# Patient Record
Sex: Male | Born: 1980 | Race: White | Hispanic: No | Marital: Single | Smoking: Current every day smoker
Health system: Southern US, Community
[De-identification: ages and names within clinical notes are randomized; demographics above are authoritative.]

## PROBLEM LIST (undated history)

## (undated) DIAGNOSIS — J45909 Unspecified asthma, uncomplicated: Secondary | ICD-10-CM

---
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
## 2009-07-08 ENCOUNTER — Emergency Department (HOSPITAL_BASED_OUTPATIENT_CLINIC_OR_DEPARTMENT_OTHER): Admission: EM | Admit: 2009-07-08 | Discharge: 2009-07-08 | Payer: Self-pay | Admitting: Emergency Medicine

## 2009-07-08 ENCOUNTER — Ambulatory Visit: Payer: Self-pay | Admitting: Interventional Radiology

## 2009-08-02 ENCOUNTER — Ambulatory Visit: Payer: Self-pay | Admitting: Diagnostic Radiology

## 2009-08-02 ENCOUNTER — Emergency Department (HOSPITAL_BASED_OUTPATIENT_CLINIC_OR_DEPARTMENT_OTHER): Admission: EM | Admit: 2009-08-02 | Discharge: 2009-08-03 | Payer: Self-pay | Admitting: Emergency Medicine

## 2009-11-06 ENCOUNTER — Encounter: Admission: RE | Admit: 2009-11-06 | Discharge: 2009-11-06 | Payer: Self-pay | Admitting: Internal Medicine

## 2009-11-17 ENCOUNTER — Ambulatory Visit: Payer: Self-pay | Admitting: Diagnostic Radiology

## 2009-11-17 ENCOUNTER — Emergency Department (HOSPITAL_BASED_OUTPATIENT_CLINIC_OR_DEPARTMENT_OTHER): Admission: EM | Admit: 2009-11-17 | Discharge: 2009-11-17 | Payer: Self-pay | Admitting: Emergency Medicine

## 2009-11-26 ENCOUNTER — Ambulatory Visit (HOSPITAL_COMMUNITY): Admission: RE | Admit: 2009-11-26 | Discharge: 2009-11-26 | Payer: Self-pay | Admitting: Family Medicine

## 2009-12-01 ENCOUNTER — Emergency Department (HOSPITAL_COMMUNITY): Admission: EM | Admit: 2009-12-01 | Discharge: 2009-12-01 | Payer: Self-pay | Admitting: Family Medicine

## 2010-08-23 IMAGING — CR DG HAND COMPLETE 3+V*R*
3 series · 3 of 3 positions shown · non-contrast
Comparison: None.

CLINICAL DATA: Right hand injury.  Pain fourth MCP.

RIGHT HAND - COMPLETE 3+ VIEW

[x hand pa right]
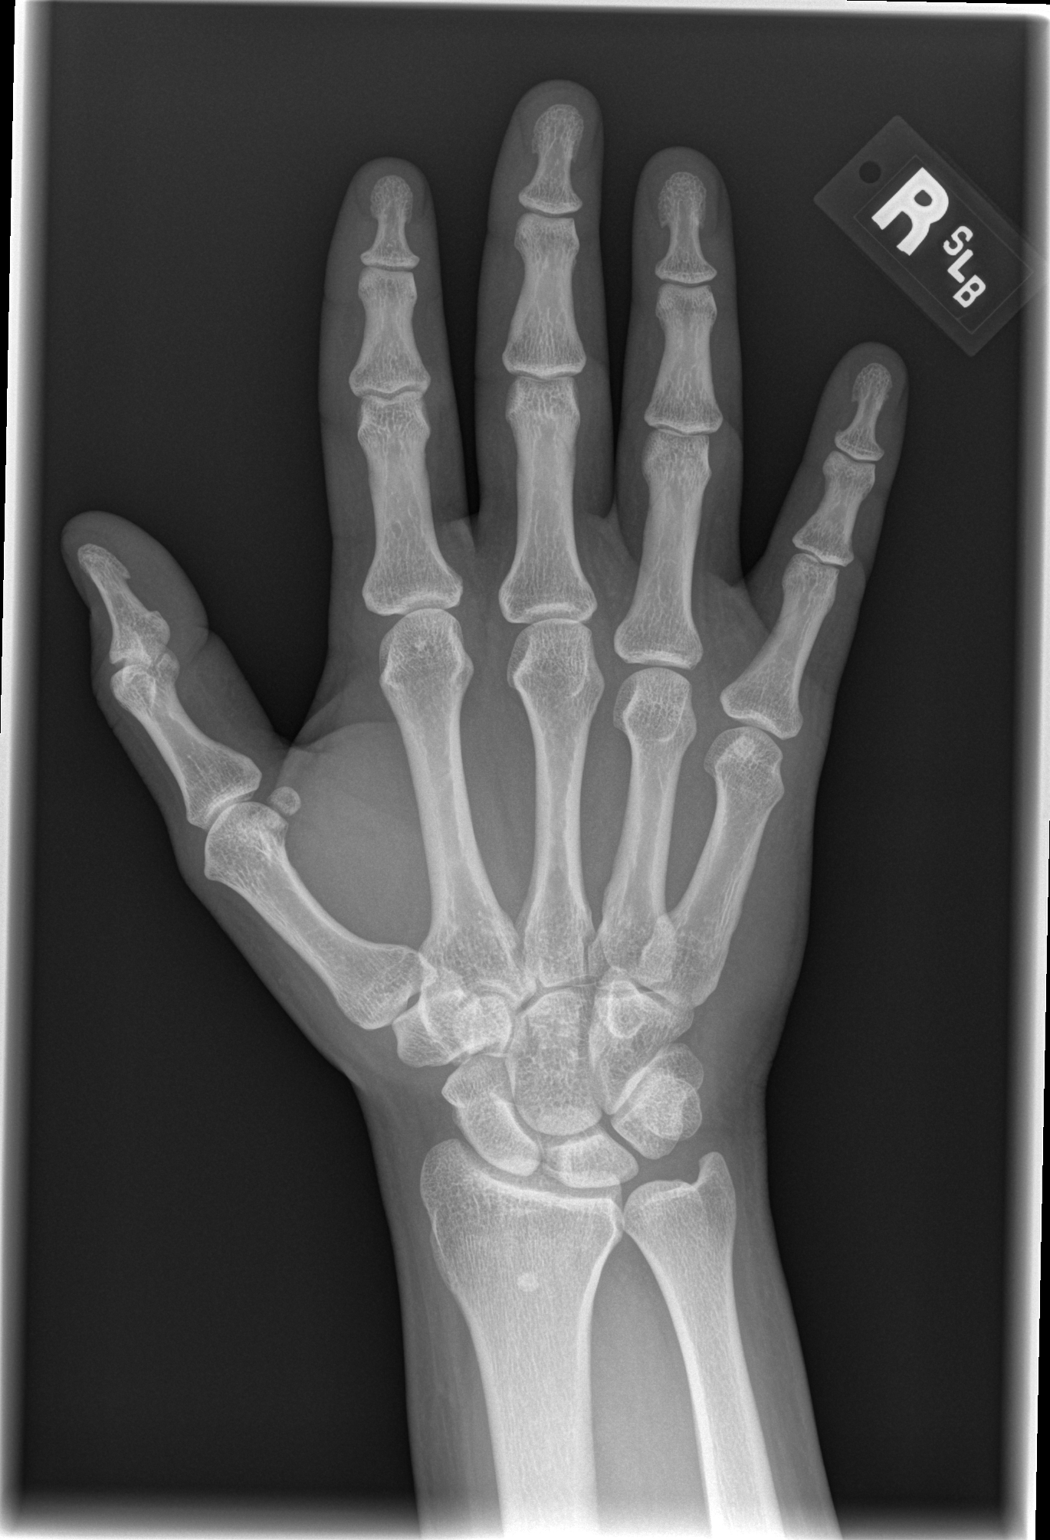

[x hand oblique right]
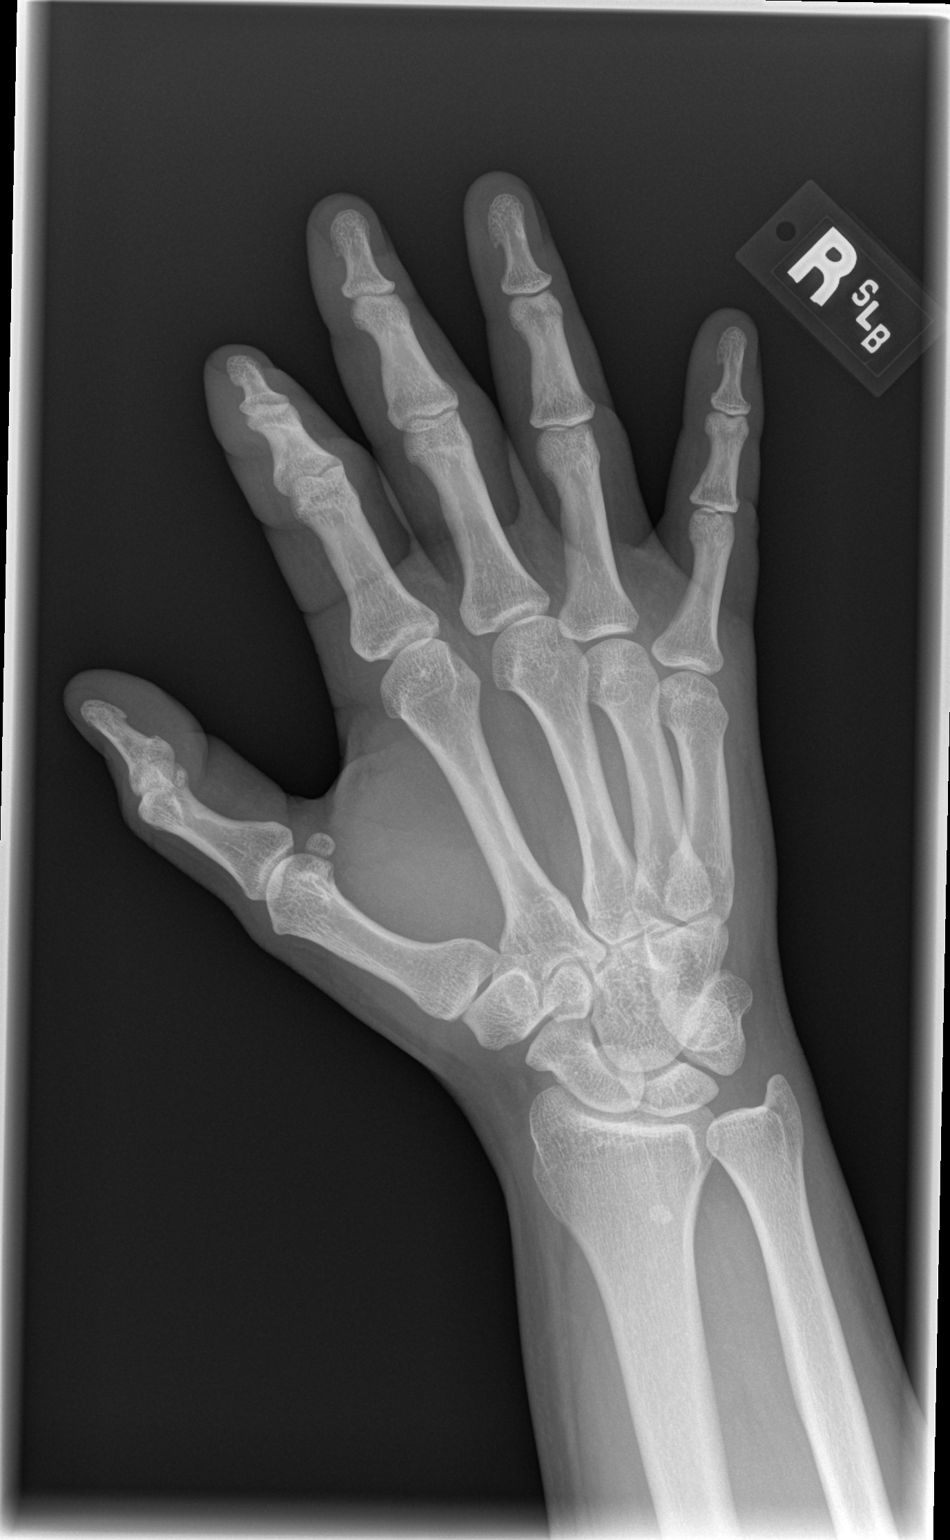

[x hand lat right]
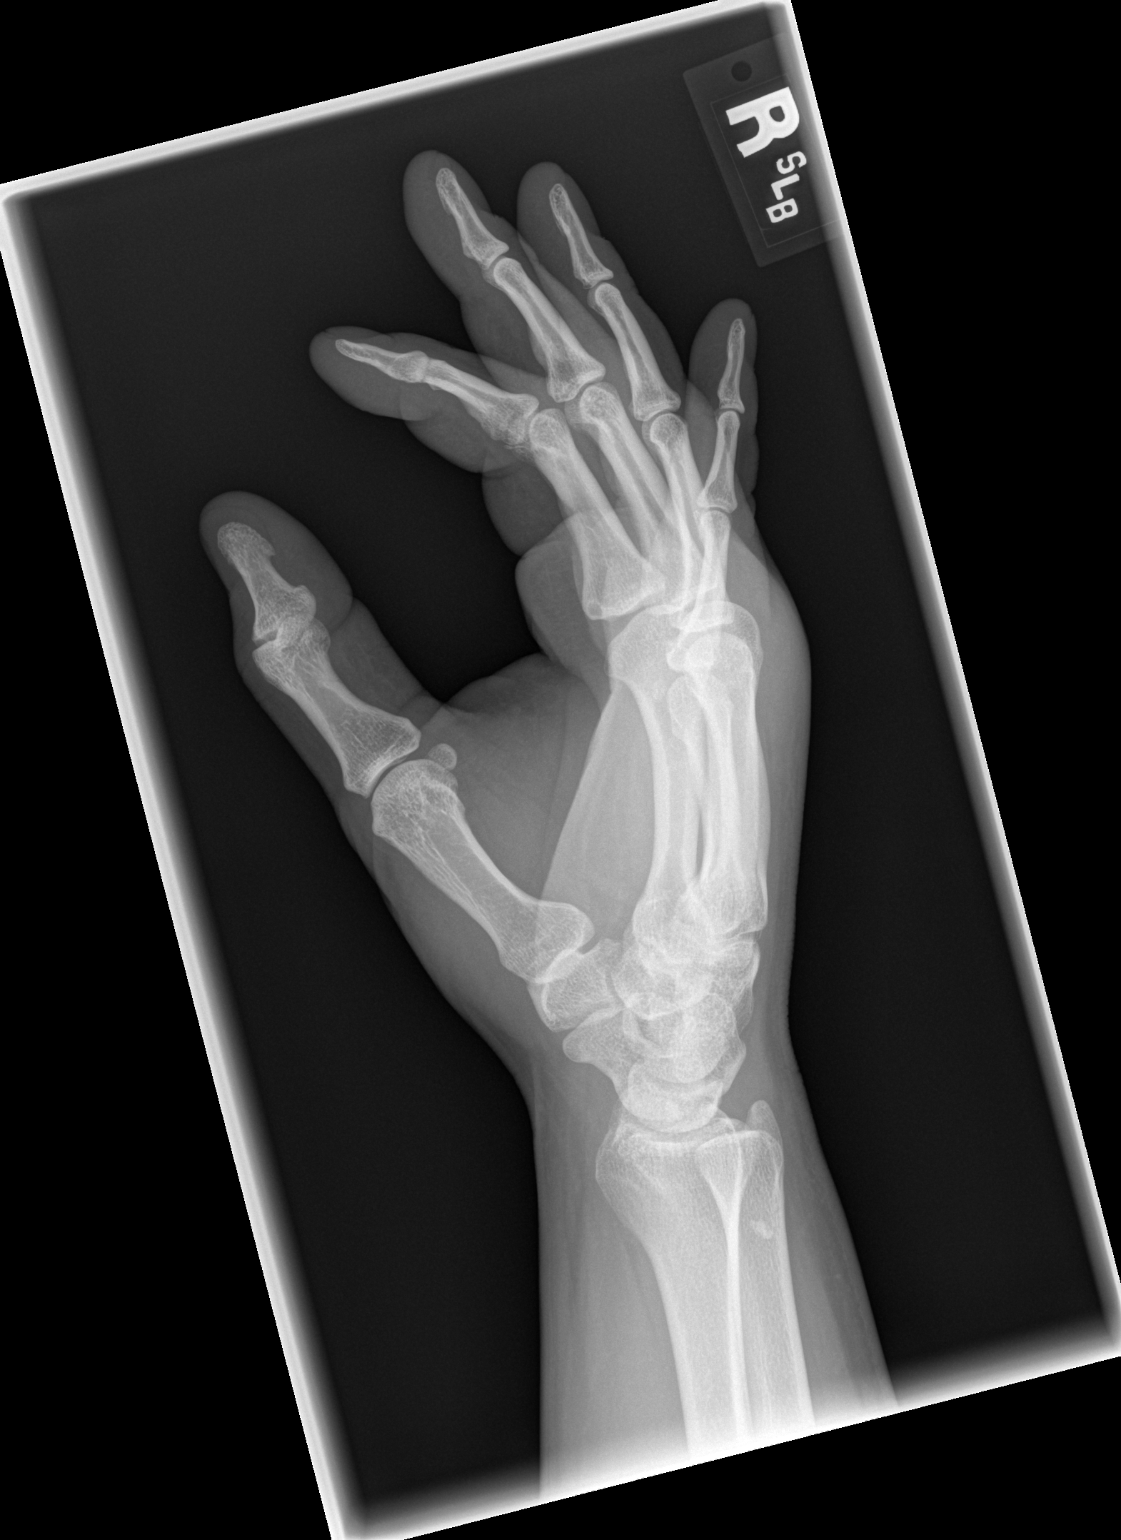

[3 of 3 positions shown; findings below may reference images not displayed]

FINDINGS: Negative for fracture.  Alignment is normal.  There is no
arthropathy or degenerative change.
IMPRESSION: Negative

## 2016-08-31 ENCOUNTER — Ambulatory Visit: Payer: Self-pay | Admitting: Sports Medicine

## 2018-09-23 ENCOUNTER — Emergency Department (HOSPITAL_COMMUNITY)
Admission: EM | Admit: 2018-09-23 | Discharge: 2018-09-24 | Disposition: A | Discharge status: Home / Self Care | Payer: Medicaid Other | Attending: Emergency Medicine | Admitting: Emergency Medicine

## 2018-09-23 DIAGNOSIS — R4689 Other symptoms and signs involving appearance and behavior: Secondary | ICD-10-CM | POA: Diagnosis present

## 2018-09-23 DIAGNOSIS — F1910 Other psychoactive substance abuse, uncomplicated: Secondary | ICD-10-CM

## 2018-09-23 DIAGNOSIS — F15259 Other stimulant dependence with stimulant-induced psychotic disorder, unspecified: Secondary | ICD-10-CM | POA: Diagnosis not present

## 2018-09-23 DIAGNOSIS — F1994 Other psychoactive substance use, unspecified with psychoactive substance-induced mood disorder: Secondary | ICD-10-CM | POA: Diagnosis present

## 2018-09-23 LAB — COMPREHENSIVE METABOLIC PANEL
ALBUMIN: 4 g/dL (ref 3.5–5.0)
ALK PHOS: 121 U/L (ref 38–126)
ALT: 25 U/L (ref 0–44)
AST: 19 U/L (ref 15–41)
Anion gap: 8 (ref 5–15)
BILIRUBIN TOTAL: 0.7 mg/dL (ref 0.3–1.2)
BUN: 11 mg/dL (ref 6–20)
CALCIUM: 9.1 mg/dL (ref 8.9–10.3)
CO2: 25 mmol/L (ref 22–32)
Chloride: 105 mmol/L (ref 98–111)
Creatinine, Ser: 1.03 mg/dL (ref 0.61–1.24)
GFR calc Af Amer: >60 mL/min (ref >60)
GFR calc non Af Amer: >60 mL/min (ref >60)
GLUCOSE: 101 mg/dL — AB (ref 70–99)
Potassium: 4 mmol/L (ref 3.5–5.1)
SODIUM: 138 mmol/L (ref 135–145)
TOTAL PROTEIN: 6.9 g/dL (ref 6.5–8.1)

## 2018-09-23 LAB — CBC WITH DIFFERENTIAL/PLATELET
Abs Immature Granulocytes: 0.04 10*3/uL (ref 0.00–0.07)
Basophils Absolute: 0.1 10*3/uL (ref 0.0–0.1)
Basophils Relative: 1 %
Eosinophils Absolute: 0.8 10*3/uL — ABNORMAL HIGH (ref 0.0–0.5)
Eosinophils Relative: 6 %
HCT: 40.9 % (ref 39.0–52.0)
Hemoglobin: 13.4 g/dL (ref 13.0–17.0)
Immature Granulocytes: 0 %
Lymphocytes Relative: 20 %
Lymphs Abs: 2.6 10*3/uL (ref 0.7–4.0)
MCH: 31.4 pg (ref 26.0–34.0)
MCHC: 32.8 g/dL (ref 30.0–36.0)
MCV: 95.8 fL (ref 80.0–100.0)
MONO ABS: 1 10*3/uL (ref 0.1–1.0)
Monocytes Relative: 8 %
NEUTROS ABS: 8.2 10*3/uL — AB (ref 1.7–7.7)
Neutrophils Relative %: 65 %
Platelets: 340 10*3/uL (ref 150–400)
RBC: 4.27 MIL/uL (ref 4.22–5.81)
RDW: 12.8 % (ref 11.5–15.5)
WBC: 12.8 10*3/uL — AB (ref 4.0–10.5)
nRBC: 0 % (ref 0.0–0.2)

## 2018-09-23 LAB — RAPID URINE DRUG SCREEN, HOSP PERFORMED
Amphetamines: POSITIVE — AB
Barbiturates: NOT DETECTED
Benzodiazepines: NOT DETECTED
COCAINE: NOT DETECTED
Opiates: NOT DETECTED
TETRAHYDROCANNABINOL: NOT DETECTED

## 2018-09-23 LAB — ETHANOL: Alcohol, Ethyl (B): <10 mg/dL (ref <10)

## 2018-09-23 LAB — ACETAMINOPHEN LEVEL

## 2018-09-23 LAB — SALICYLATE LEVEL

## 2018-09-23 MED ORDER — FAMOTIDINE 20 MG PO TABS
20.0000 mg | ORAL_TABLET | Freq: Once | ORAL | Status: AC
  Administered 2018-09-23: 20 mg via ORAL
  Filled 2018-09-23: qty 1

## 2018-09-23 MED ORDER — DIPHENHYDRAMINE HCL 25 MG PO CAPS
50.0000 mg | ORAL_CAPSULE | Freq: Once | ORAL | Status: AC
  Administered 2018-09-23: 50 mg via ORAL
  Filled 2018-09-23: qty 2

## 2018-09-23 NOTE — ED Notes (Addendum)
Pt making second phone call. Reiterated to patient that this is his final call for the evening. EDIT: pt unable to reach whoever he was trying to call. Speaking inintelligbly, back to room

## 2018-09-23 NOTE — ED Provider Notes (Signed)
[facility] EMERGENCY DEPARTMENT Provider Note   CSN: [id number] Arrival date & time: 09/23/18  1326    History   Chief Complaint Chief Complaint  Patient presents with  . Aggressive Behavior    HPI AZAREL MCNIFF is a 38 y.o. male.     HPI    38 year old male presents under IVC with [facility].  Patient has a significant psychiatric history including bipolar and schizophrenia.  Police Department has been called out to the resident several times for aggressive behavior, auditory hallucinations.  Per [employer] he has been talking to his grandfather who is no longer around.  He was so aggressive with a or close to having to use a taser on him.  On my evaluation patient is very tangential, he is speaking rapidly and pressured, I am unable to obtain any significant information from him.  He is resting comfortably in exam bed.  He notes he did "use ice" yesterday.    No past medical history on file.  There are no active problems to display for this patient.        Home Medications    Prior to Admission medications   Medication Sig Start Date End Date Taking? Authorizing Provider  PROAIR HFA 108 [phone] Base) MCG/ACT inhaler Inhale 2 puffs into the lungs 4 (four) times daily as needed for wheezing. 08/27/18   [provider]    Family History No family history on file.  Social History Social History   Tobacco Use  . Smoking status: Not on file  Substance Use Topics  . Alcohol use: Not on file  . Drug use: Not on file     Allergies   Patient has no allergy information on record.   Review of Systems Review of Systems  All other systems reviewed and are negative.    Physical Exam Updated Vital Signs BP 125/80 (BP Location: Right Arm)   Pulse 88   Temp 97.9 F (36.6 C) (Oral)   Resp 18   SpO2 96%   Physical Exam Vitals signs and nursing note reviewed.  Constitutional:      Appearance: He  is well-developed.  HENT:     Head: Normocephalic and atraumatic.  Eyes:     General: No scleral icterus.       Right eye: No discharge.        Left eye: No discharge.     Conjunctiva/sclera: Conjunctivae normal.     Pupils: Pupils are equal, round, and reactive to light.  Neck:     Musculoskeletal: Normal range of motion.     Vascular: No JVD.     Trachea: No tracheal deviation.  Pulmonary:     Effort: Pulmonary effort is normal.     Breath sounds: No stridor.  Neurological:     Mental Status: He is alert.     Coordination: Coordination normal.  Psychiatric:        Behavior: Behavior normal.        Thought Content: Thought content normal.        Judgment: Judgment normal.      ED Treatments / Results  Labs (all labs ordered are listed, but only abnormal results are displayed) Labs Reviewed  COMPREHENSIVE METABOLIC PANEL - Abnormal; Notable for the following components:      Result Value   Glucose, Bld 101 (*)    All other components within normal limits  RAPID URINE DRUG SCREEN, HOSP PERFORMED - Abnormal; Notable for the  following components:   Amphetamines POSITIVE (*)    All other components within normal limits  CBC WITH DIFFERENTIAL/PLATELET - Abnormal; Notable for the following components:   WBC 12.8 (*)    Neutro Abs 8.2 (*)    Eosinophils Absolute 0.8 (*)    All other components within normal limits  ACETAMINOPHEN LEVEL - Abnormal; Notable for the following components:   Acetaminophen (Tylenol), Serum <10 (*)    All other components within normal limits  ETHANOL  SALICYLATE LEVEL    EKG EKG Interpretation  Date/Time:  Monday September 23 2018 14:57:42 EDT Ventricular Rate:  92 PR Interval:  170 QRS Duration: 72 QT Interval:  350 QTC Calculation: 432 R Axis:   77 Text Interpretation:  Sinus rhythm Low voltage QRS Septal infarct , age undetermined Abnormal ECG Baseline wander Artifact No old tracing to compare Confirmed by Dione Booze ([id number]) on  09/23/2018 11:01:50 PM   Radiology No results found.  Procedures Procedures (including critical care time)  Medications Ordered in ED Medications  diphenhydrAMINE (BENADRYL) capsule 50 mg (50 mg Oral Given 09/23/18 1619)  famotidine (PEPCID) tablet 20 mg (20 mg Oral Given 09/23/18 1656)     Initial Impression / Assessment and Plan / ED Course  I have reviewed the triage vital signs and the nursing notes.  Pertinent labs & imaging results that were available during my care of the patient were reviewed by me and considered in my medical decision making (see chart for details).        39 year old male presents today under IVC with [employer].  Patient does appear to be hallucinating although I cannot obtain any significant information from him at this time.  I do believe that holding the patient here for further evaluation would be in his best interest at this time.  Patient will be medically screened, TTS will be consulted and he will be transferred to [facility] as soon as safely appropriate.  Final Clinical Impressions(s) / ED Diagnoses   Final diagnoses:  None    ED Discharge Orders    None       Rosalio Loud 09/24/18 1031    Blane Ohara, MD 09/28/18 2014

## 2018-09-23 NOTE — ED Notes (Signed)
Belongings inventoried and wanded by security

## 2018-09-23 NOTE — ED Notes (Signed)
Copy of IVC papers faxed to [facility] and placed in medical records drawer and folder for magistrate

## 2018-09-23 NOTE — ED Notes (Signed)
Pt observed to be talking to self and rocking back and forth in bed. Asked pt if he would like something to help him sleep, he said yes. Orders for PO benedryl placed and given

## 2018-09-23 NOTE — ED Notes (Addendum)
Making first phone call, explained two call a day policy. Constantly moving phone back and forth away from mouth and mouthing additional words

## 2018-09-23 NOTE — BH Assessment (Addendum)
Assessment Note  Ronald Roberson is an 38 y.o. male.  The pt came in after being IVC'd by his wife due to bizarre, psychotic behavior.  The pt's thought process was difficult to follow, however the pt was able to answer most questions.  The pt stated he has been doing crystal meth daily for the past 2-3 years and last used this morning.  The pt appears to be responding to internal stimuli.  The IVC paperwork stated the pt has been talking to his deceased father and grand father.  The pt denies this.  The pt stated he has gone to [facility] for [facility].  He denies any inpatient treatment.  The pt lives with his wife, step children, mother, sister, brother in law and their children.  The pt denies SI, HI, self harm, legal issues, and history of abuse.  The pt is sleeping about 5 hours a night and he has a good appetite.  In addition to crystal meth, the pt has abused pain pills in the past.  The pt initially stated he hasn't used pain pills in a year and then said "a few months".  The pt's UDS is positive for amphetamines.  Pt is dressed in scrubs. He is alert and oriented x4. Pt speaks in a clear tone, at moderate volume and rapid pace. Eye contact is poor. Pt's mood is restless.  The pt continued to move around during the assessment and he later hit the bed.  The pt stated he had a cut from his ear to his neck. Thought process is tangential There is  indication Pt is currently responding to internal stimuli or experiencing delusional thought content.?Pt was cooperative throughout assessment.     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   Family History: No family history on file.  Social History:  has no history on file for tobacco, alcohol, and drug.  Additional Social History:  Alcohol / Drug Use Pain Medications: See MAR Prescriptions: See MAR Over the Counter: See  MAR History of alcohol / drug use?: Yes Longest period of sobriety (when/how long): unknown Substance #1 Name of Substance 1: Crystal Meth 1 - Age of First Use: 33 1 - Amount (size/oz): 1-2 grams 1 - Frequency: daily 1 - Last Use / Amount: 09/23/2018 Substance #2 Name of Substance 2: pain pills 2 - Last Use / Amount: 3 months ago  CIWA: CIWA-Ar BP: 130/74 Pulse Rate: 92 COWS:    Allergies: Allergies not on file  Home Medications: (Not in a hospital admission)   OB/GYN Status:  No LMP for male patient.  General Assessment Data Location of Assessment: [facility] ED TTS Assessment: In system Is this a Tele or Face-to-Face Assessment?: Face-to-Face Is this an Initial Assessment or a Re-assessment for this encounter?: Initial Assessment Patient Accompanied by:: N/A Language Other than English: No Living Arrangements: Other (Comment)(home) What gender do you identify as?: Male Marital status: Married Living Arrangements: Parent, Spouse/significant other, Other relatives Can pt return to current living arrangement?: Yes Admission Status: Involuntary Petitioner: Family member Is patient capable of signing voluntary admission?: No Referral Source: Self/Family/Friend Insurance type: Self Pay     Crisis Care Plan Living Arrangements: Parent, Spouse/significant other, Other relatives Legal Guardian: Other:(Self) Name of Psychiatrist: [facility] Name of Therapist: [facility]  Education Status Is patient currently in school?: No Is the patient employed, unemployed or  receiving disability?: Unemployed  Risk to self with the past 6 months Suicidal Ideation: No Has patient been a risk to self within the past 6 months prior to admission? : No Suicidal Intent: No Has patient had any suicidal intent within the past 6 months prior to admission? : No Is patient at risk for suicide?: No Suicidal Plan?: No Has patient had any suicidal plan within the past 6 months prior to admission?  : No Access to Means: No What has been your use of drugs/alcohol within the last 12 months?: crystal meth use Previous Attempts/Gestures: No How many times?: 0 Other Self Harm Risks: none Triggers for Past Attempts: None known Intentional Self Injurious Behavior: None Family Suicide History: No Recent stressful life event(s): Other (Comment)(none mentioned) Persecutory voices/beliefs?: Yes Depression: No Substance abuse history and/or treatment for substance abuse?: Yes Suicide prevention information given to non-admitted patients: Not applicable  Risk to Others within the past 6 months Homicidal Ideation: No Does patient have any lifetime risk of violence toward others beyond the six months prior to admission? : No Thoughts of Harm to Others: No Current Homicidal Intent: No Current Homicidal Plan: No Access to Homicidal Means: No Identified Victim: none History of harm to others?: No Assessment of Violence: None Noted Violent Behavior Description: was "agressive" earlier, but didn't hit anyone Does patient have access to weapons?: No Criminal Charges Pending?: No Does patient have a court date: No Is patient on probation?: No  Psychosis Hallucinations: Auditory(unknown appears to be talking to someont) Delusions: (unknown)  Mental Status Report Appearance/Hygiene: Unremarkable, In scrubs Eye Contact: Fair Motor Activity: Freedom of movement, Restlessness Speech: Incoherent Level of Consciousness: Restless Mood: Other (Comment)(restless) Affect: Anxious Anxiety Level: Moderate Thought Processes: Tangential Judgement: Impaired Orientation: Person, Time, Place, Situation Obsessive Compulsive Thoughts/Behaviors: None  Cognitive Functioning Concentration: Decreased Memory: Recent Intact, Remote Intact Is patient IDD: No Insight: Poor Impulse Control: Poor Appetite: Good Have you had any weight changes? : No Change Sleep: Decreased Total Hours of Sleep:  5 Vegetative Symptoms: None  ADLScreening [facility] Assessment Services) Patient's cognitive ability adequate to safely complete daily activities?: Yes Patient able to express need for assistance with ADLs?: Yes Independently performs ADLs?: Yes (appropriate for developmental age)  Prior Inpatient Therapy Prior Inpatient Therapy: No  Prior Outpatient Therapy Prior Outpatient Therapy: Yes Prior Therapy Dates: current Prior Therapy Facilty/Provider(s): [facility] Reason for Treatment: substance use Does patient have an ACCT team?: No Does patient have Intensive In-House Services?  : No Does patient have [facility] services? : No Does patient have P4CC services?: No  ADL Screening (condition at time of admission) Patient's cognitive ability adequate to safely complete daily activities?: Yes Patient able to express need for assistance with ADLs?: Yes Independently performs ADLs?: Yes (appropriate for developmental age)       Abuse/Neglect Assessment (Assessment to be complete while patient is alone) Abuse/Neglect Assessment Can Be Completed: Yes Physical Abuse: Denies Verbal Abuse: Denies Sexual Abuse: Denies Exploitation of patient/patient's resources: Denies Self-Neglect: Denies Values / Beliefs Cultural Requests During Hospitalization: None Spiritual Requests During Hospitalization: None Consults Social Work Consult Needed: No            Disposition:  Disposition Initial Assessment Completed for this Encounter: Yes   NP Hillery Jacks recommends the pt be observed overnight for safety and stabilization.  The pt is to be reassessed by psychiatry in the morning.  On Site Evaluation by:   Reviewed with Physician:    Ottis Stain 09/23/2018  4:32 PM

## 2018-09-23 NOTE — ED Triage Notes (Signed)
Pt in via [facility], per report pt was aggressive at [facility] and has not been taking his medications, pt hx of bipolar & schizophrenia, pt uncooperative with [employer], pt arrives to ED in  Handcuffs, alert, denies auditory hallucinations, per [employer] the pt was talking to a person named Marcy Salvo but no one was present

## 2018-09-23 NOTE — ED Notes (Signed)
Staffing called: Sitter coming at 1500

## 2018-09-23 NOTE — ED Notes (Signed)
ED Provider at bedside. 

## 2018-09-24 ENCOUNTER — Other Ambulatory Visit: Payer: Self-pay

## 2018-09-24 DIAGNOSIS — F1994 Other psychoactive substance use, unspecified with psychoactive substance-induced mood disorder: Secondary | ICD-10-CM

## 2018-09-24 NOTE — Discharge Instructions (Addendum)
Please read attached information. If you experience any new or worsening signs or symptoms please return to the emergency room for evaluation. Please follow-up with your primary care provider or specialist as discussed.   For your behavioral health needs, you are advised to continue treatment with [facility]:       [facility]      [address]      [place], [place] [address]      [phone]

## 2018-09-24 NOTE — ED Notes (Signed)
Telepscyh completed.
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
## 2018-09-24 NOTE — ED Notes (Signed)
Lunch tray ordered 

## 2018-09-24 NOTE — ED Provider Notes (Signed)
Emergency Medicine Observation Re-evaluation Note  Ronald Roberson is a 38 y.o. male, seen on rounds today.  Pt initially presented to the ED for complaints of Aggressive Behavior  Patient presents under IVC with [employer].  Significant psychiatric history including bipolar and schizophrenia.  [employer] was called out yesterday for aggressive behavior as well as auditory hallucinations.  Admits to polysubstance abuse.  Patient medically cleared by previous provider on 09/23/2018.    Plan for overnight observation for safety and stability with reassessment by psychiatry today.  Physical Exam  BP 125/80 (BP Location: Right Arm)   Pulse 88   Temp 97.9 F (36.6 C) (Oral)   Resp 18   SpO2 96%  Physical Exam Vitals signs and nursing note reviewed.  Constitutional:      General: He is not in acute distress.    Appearance: He is well-developed. He is not diaphoretic.  HENT:     Head: Atraumatic.  Eyes:     Pupils: Pupils are equal, round, and reactive to light.  Neck:     Musculoskeletal: Normal range of motion and neck supple.  Cardiovascular:     Rate and Rhythm: Normal rate and regular rhythm.  Pulmonary:     Effort: Pulmonary effort is normal. No respiratory distress.  Abdominal:     General: There is no distension.     Palpations: Abdomen is soft.  Musculoskeletal: Normal range of motion.  Skin:    General: Skin is warm and dry.  Neurological:     Mental Status: He is alert.     ED Course / MDM  EKG:EKG Interpretation  Date/Time:  Monday September 23 2018 14:57:42 EDT Ventricular Rate:  92 PR Interval:  170 QRS Duration: 72 QT Interval:  350 QTC Calculation: 432 R Axis:   77 Text Interpretation:  Sinus rhythm Low voltage QRS Septal infarct , age undetermined Abnormal ECG Baseline wander Artifact No old tracing to compare Confirmed by Dione Booze ([id number]) on 09/23/2018 11:01:50 PM    I have reviewed the labs performed to date as well as medications administered while in observation.   Recent changes in the last 24 hours include none Plan  Current plan is for assessment by psychiatry this morning.  Patient sleeping on initial evaluation, however arousable by voice.  Vital signs stable throughout night. No complaints. Patient is under full IVC at this time.   Lismary Kiehn A, PA-C 09/24/18 9628    Eber Hong, MD 09/24/18 [phone]

## 2018-09-24 NOTE — ED Notes (Signed)
Pt is eating lunch in room.

## 2018-09-24 NOTE — ED Notes (Signed)
Pt was given a drink and snack.

## 2018-09-24 NOTE — ED Notes (Signed)
Pt on phone at nurses's desk.  

## 2018-09-24 NOTE — ED Notes (Signed)
Pt was given water. Pt has been pacing from room to nursing station. Pt was told to have a seat in his room. Pt keeps asking if he is getting discharged. Tech notified pt needs wants to be updated on his status.
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
## 2018-09-24 NOTE — ED Notes (Signed)
Pt noted to be continuously talking to himself.

## 2018-09-24 NOTE — ED Provider Notes (Signed)
38 year old male here under IVC via [employer].  Patient was observed overnight.  He was medically cleared and recommended for outpatient management by behavioral health resources.  Patient appears in no acute distress is safe and stable for outpatient management.  Vitals:   09/24/18 0748 09/24/18 1211  BP: 125/80 101/70  Pulse: 88 79  Resp: 18 18  Temp:    SpO2: 96% 98%     Eyvonne Mechanic, PA-C 09/24/18 1250    Gwyneth Sprout, MD 09/25/18 2054

## 2018-09-24 NOTE — ED Notes (Signed)
Pt made phone call from phone at nurses' desk.  

## 2018-09-24 NOTE — ED Notes (Addendum)
IVC papers rescinded - copy faxed to [employer], copy sent to Medical Records, and original placed in folder for [employer]. ALL belongings - 2 labeled belongings bags and 1 valuables envelope - returned to pt - Pt signed verifying all items present. D/C paperwork discussed and given.

## 2019-01-16 ENCOUNTER — Emergency Department (HOSPITAL_COMMUNITY)
Admission: EM | Admit: 2019-01-16 | Discharge: 2019-01-17 | Disposition: A | Discharge status: Other Acute Inpatient Hospital | Payer: Medicaid Other | Attending: Emergency Medicine | Admitting: Emergency Medicine

## 2019-01-16 ENCOUNTER — Encounter (HOSPITAL_COMMUNITY): Payer: Self-pay | Admitting: Emergency Medicine

## 2019-01-16 ENCOUNTER — Other Ambulatory Visit: Payer: Self-pay

## 2019-01-16 DIAGNOSIS — R451 Restlessness and agitation: Secondary | ICD-10-CM | POA: Diagnosis present

## 2019-01-16 DIAGNOSIS — F15959 Other stimulant use, unspecified with stimulant-induced psychotic disorder, unspecified: Secondary | ICD-10-CM | POA: Diagnosis not present

## 2019-01-16 DIAGNOSIS — F329 Major depressive disorder, single episode, unspecified: Secondary | ICD-10-CM | POA: Insufficient documentation

## 2019-01-16 DIAGNOSIS — Z20828 Contact with and (suspected) exposure to other viral communicable diseases: Secondary | ICD-10-CM | POA: Insufficient documentation

## 2019-01-16 DIAGNOSIS — F1994 Other psychoactive substance use, unspecified with psychoactive substance-induced mood disorder: Secondary | ICD-10-CM | POA: Diagnosis present

## 2019-01-16 LAB — CBC WITH DIFFERENTIAL/PLATELET
Abs Immature Granulocytes: 0.07 10*3/uL (ref 0.00–0.07)
Basophils Absolute: 0.1 10*3/uL (ref 0.0–0.1)
Basophils Relative: 1 %
Eosinophils Absolute: 0.2 10*3/uL (ref 0.0–0.5)
Eosinophils Relative: 2 %
HCT: 41.9 % (ref 39.0–52.0)
Hemoglobin: 13.7 g/dL (ref 13.0–17.0)
Immature Granulocytes: 1 %
Lymphocytes Relative: 18 %
Lymphs Abs: 2.4 10*3/uL (ref 0.7–4.0)
MCH: 31.6 pg (ref 26.0–34.0)
MCHC: 32.7 g/dL (ref 30.0–36.0)
MCV: 96.5 fL (ref 80.0–100.0)
Monocytes Absolute: 0.8 10*3/uL (ref 0.1–1.0)
Monocytes Relative: 6 %
Neutro Abs: 9.4 10*3/uL — ABNORMAL HIGH (ref 1.7–7.7)
Neutrophils Relative %: 72 %
Platelets: 322 10*3/uL (ref 150–400)
RBC: 4.34 MIL/uL (ref 4.22–5.81)
RDW: 12.6 % (ref 11.5–15.5)
WBC: 12.9 10*3/uL — ABNORMAL HIGH (ref 4.0–10.5)
nRBC: 0 % (ref 0.0–0.2)

## 2019-01-16 LAB — COMPREHENSIVE METABOLIC PANEL
ALT: 22 U/L (ref 0–44)
AST: 19 U/L (ref 15–41)
Albumin: 4.3 g/dL (ref 3.5–5.0)
Alkaline Phosphatase: 117 U/L (ref 38–126)
Anion gap: 10 (ref 5–15)
BUN: 13 mg/dL (ref 6–20)
CO2: 24 mmol/L (ref 22–32)
Calcium: 9.4 mg/dL (ref 8.9–10.3)
Chloride: 104 mmol/L (ref 98–111)
Creatinine, Ser: 1.37 mg/dL — ABNORMAL HIGH (ref 0.61–1.24)
GFR calc Af Amer: >60 mL/min (ref >60)
GFR calc non Af Amer: >60 mL/min (ref >60)
Glucose, Bld: 121 mg/dL — ABNORMAL HIGH (ref 70–99)
Potassium: 3.6 mmol/L (ref 3.5–5.1)
Sodium: 138 mmol/L (ref 135–145)
Total Bilirubin: 0.4 mg/dL (ref 0.3–1.2)
Total Protein: 7.6 g/dL (ref 6.5–8.1)

## 2019-01-16 LAB — RAPID URINE DRUG SCREEN, HOSP PERFORMED
Amphetamines: POSITIVE — AB
Barbiturates: NOT DETECTED
Benzodiazepines: NOT DETECTED
Cocaine: NOT DETECTED
Opiates: NOT DETECTED
Tetrahydrocannabinol: NOT DETECTED

## 2019-01-16 LAB — ETHANOL: Alcohol, Ethyl (B): <10 mg/dL (ref <10)

## 2019-01-16 LAB — SARS CORONAVIRUS 2 BY RT PCR (HOSPITAL ORDER, PERFORMED IN ~~LOC~~ HOSPITAL LAB): SARS Coronavirus 2: NEGATIVE

## 2019-01-16 LAB — SALICYLATE LEVEL: Salicylate Lvl: <7 mg/dL (ref 2.8–30.0)

## 2019-01-16 LAB — ACETAMINOPHEN LEVEL: Acetaminophen (Tylenol), Serum: <10 ug/mL — ABNORMAL LOW (ref 10–30)

## 2019-01-16 LAB — CBG MONITORING, ED: Glucose-Capillary: 134 mg/dL — ABNORMAL HIGH (ref 70–99)

## 2019-01-16 MED ORDER — ALUM & MAG HYDROXIDE-SIMETH 200-200-20 MG/5ML PO SUSP: 30.0000 mL | Freq: Four times a day (QID) | ORAL | Status: DC | PRN

## 2019-01-16 MED ORDER — ZIPRASIDONE MESYLATE 20 MG IM SOLR
INTRAMUSCULAR | Status: AC
  Filled 2019-01-16: qty 20

## 2019-01-16 MED ORDER — ONDANSETRON HCL 4 MG PO TABS: 4.0000 mg | ORAL_TABLET | Freq: Three times a day (TID) | ORAL | Status: DC | PRN

## 2019-01-16 MED ORDER — HALOPERIDOL LACTATE 5 MG/ML IJ SOLN
INTRAMUSCULAR | Status: AC
  Administered 2019-01-16: 5 mg via INTRAMUSCULAR
  Filled 2019-01-16: qty 1

## 2019-01-16 MED ORDER — ACETAMINOPHEN 325 MG PO TABS: 650.0000 mg | ORAL_TABLET | ORAL | Status: DC | PRN

## 2019-01-16 MED ORDER — ALBUTEROL SULFATE HFA 108 (90 BASE) MCG/ACT IN AERS: 1.0000 | INHALATION_SPRAY | RESPIRATORY_TRACT | Status: DC | PRN

## 2019-01-16 MED ORDER — NICOTINE 21 MG/24HR TD PT24: 21.0000 mg | MEDICATED_PATCH | Freq: Every day | TRANSDERMAL | Status: DC

## 2019-01-16 MED ORDER — STERILE WATER FOR INJECTION IJ SOLN
INTRAMUSCULAR | Status: AC
  Filled 2019-01-16: qty 10
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
## 2019-01-16 NOTE — Care Management (Addendum)
  Patient has been accepted to the [facility]  The number to give report is [phone] Bed open today  Accepting Dr. Is Elaina Hoops Writer informed the RN working with the patient    RN reports that she will inform the ER MD that the patient has been placed at [facility].

## 2019-01-16 NOTE — ED Notes (Signed)
Patient arrived in TCU calm and cooperative.  He is sleeping now.
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
## 2019-01-16 NOTE — BH Assessment (Signed)
[facility] Assessment Progress Note  Per Buford Dresser, DO, this pt requires psychiatric hospitalization at this time.  Pt presents under IVC initiated by law enforcement and upheld by Dr Mariea Clonts.  The following facilities have been contacted to seek placement for this pt, with results as noted:  Beds available, information sent, decision pending:  [facility]   At capacity:  [facility], [place] Coordinator [phone]

## 2019-01-16 NOTE — BH Assessment (Signed)
[facility] Assessment Progress Note   Pt administered haldol at 00:56.  Will attempt assessment when patient is alert.

## 2019-01-16 NOTE — ED Notes (Signed)
Sheriff called for transport  

## 2019-01-16 NOTE — ED Notes (Signed)
Police called for transport. 

## 2019-01-16 NOTE — ED Notes (Signed)
Asked to shower and was able to do so. Sitter present outside of bathroom door. He frequently coughs, loudly clears throat, snorts, and expectorates. He asked Probation officer for inhaler, states uses one at home. Consulted Dr for an order and was given ventolin inhaler prn order.  Sleeping on and off much of shift. Able to make needs known. Sitter present outside of his door for his safety at this time.

## 2019-01-16 NOTE — ED Notes (Signed)
Report called to Ellin Mayhew, nurse Gatha Mayer RN
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
## 2019-01-16 NOTE — ED Notes (Signed)
Patient resting quietly.

## 2019-01-16 NOTE — ED Notes (Signed)
Patient on call with telepsych

## 2019-01-16 NOTE — ED Notes (Addendum)
One patient belonging bag taken and secured behind nurse's station, has patient's shorts in it. No other belongings noted. Patient changed into wine-colored scrubs and non-slip socks applied. Denies SI or HI at this time.

## 2019-01-16 NOTE — ED Notes (Signed)
On arrival to unit told by day shift RN patient would be transported tonight to [facility] and had been accepted and report called earlier today. [employer] here to complete IVC paperwork and informed writer Sherriff doesn't transport in the pm only in the am. Informed Javar of the change, he is accepting of it. [facility] notified transport wont be till Dietitian was told they would hold his bed till that time. Notified charge nurse of delay as well as attending Dr. Eston Esters left on Sgt Yonah voicemail of patient need for transport in the am. Charge nurse tonight believes transport happens about 8am .
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
## 2019-01-17 NOTE — ED Notes (Signed)
[facility] transported pt to [facility]. All belongings given to the sheriff. PT was cooperative.

## 2019-01-17 NOTE — ED Provider Notes (Signed)
At this time, he is awake and alert.  He is somewhat defensive, and thinks that he is going home.  I informed him that he was under involuntary commitment, and he has been transferred to a psychiatric hospital.  At that point he was seen to be talking to himself, and was noted to be somewhat agitated however stated his bed.  Law enforcement was nearby, to maintain control over him.  Transfer paperwork completed by me.   Daleen Bo, MD 01/17/19 1031

## 2019-01-17 NOTE — ED Notes (Signed)
Informed Ronald Roberson at  [name] that [facility] is on the way to bring the pt. Pt is calm and cooperative.

## 2019-01-17 NOTE — ED Notes (Signed)
Verified with [facility] that pt will be transported to [facility] in about 1-1/2 hours.

## 2023-02-20 ENCOUNTER — Encounter (HOSPITAL_BASED_OUTPATIENT_CLINIC_OR_DEPARTMENT_OTHER): Payer: Self-pay | Admitting: Emergency Medicine

## 2023-02-20 ENCOUNTER — Other Ambulatory Visit (HOSPITAL_BASED_OUTPATIENT_CLINIC_OR_DEPARTMENT_OTHER): Payer: Self-pay

## 2023-02-20 ENCOUNTER — Other Ambulatory Visit: Payer: Self-pay

## 2023-02-20 ENCOUNTER — Emergency Department (HOSPITAL_BASED_OUTPATIENT_CLINIC_OR_DEPARTMENT_OTHER)
Admission: EM | Admit: 2023-02-20 | Discharge: 2023-02-20 | Disposition: A | Discharge status: Home / Self Care | Payer: MEDICAID | Attending: Emergency Medicine | Admitting: Emergency Medicine

## 2023-02-20 DIAGNOSIS — J4521 Mild intermittent asthma with (acute) exacerbation: Secondary | ICD-10-CM | POA: Insufficient documentation

## 2023-02-20 DIAGNOSIS — U071 COVID-19: Secondary | ICD-10-CM | POA: Diagnosis not present

## 2023-02-20 DIAGNOSIS — Z7951 Long term (current) use of inhaled steroids: Secondary | ICD-10-CM | POA: Insufficient documentation

## 2023-02-20 DIAGNOSIS — R059 Cough, unspecified: Secondary | ICD-10-CM | POA: Diagnosis present

## 2023-02-20 LAB — SARS CORONAVIRUS 2 BY RT PCR: SARS Coronavirus 2 by RT PCR: POSITIVE — AB

## 2023-02-20 MED ORDER — AEROCHAMBER PLUS FLO-VU MEDIUM MISC
1.0000 | Freq: Once | Status: DC
  Filled 2023-02-20: qty 1

## 2023-02-20 MED ORDER — ALBUTEROL SULFATE (2.5 MG/3ML) 0.083% IN NEBU
INHALATION_SOLUTION | RESPIRATORY_TRACT | Status: AC
  Administered 2023-02-20: 2.5 mg via RESPIRATORY_TRACT
  Filled 2023-02-20: qty 3

## 2023-02-20 MED ORDER — IPRATROPIUM-ALBUTEROL 0.5-2.5 (3) MG/3ML IN SOLN: 3.0000 mL | Freq: Once | RESPIRATORY_TRACT | Status: AC

## 2023-02-20 MED ORDER — PAXLOVID (300/100) 20 X 150 MG & 10 X 100MG PO TBPK
3.0000 | ORAL_TABLET | Freq: Two times a day (BID) | ORAL | 0 refills | Status: AC
  Filled 2023-02-20: qty 30, 5d supply, fill #0

## 2023-02-20 MED ORDER — ALBUTEROL SULFATE (2.5 MG/3ML) 0.083% IN NEBU: 2.5000 mg | INHALATION_SOLUTION | Freq: Once | RESPIRATORY_TRACT | Status: AC

## 2023-02-20 MED ORDER — PREDNISONE 10 MG PO TABS
40.0000 mg | ORAL_TABLET | Freq: Every day | ORAL | 0 refills | Status: AC
  Filled 2023-02-20: qty 16, 4d supply, fill #0

## 2023-02-20 MED ORDER — ALBUTEROL SULFATE (2.5 MG/3ML) 0.083% IN NEBU
2.5000 mg | INHALATION_SOLUTION | Freq: Four times a day (QID) | RESPIRATORY_TRACT | 12 refills | Status: DC | PRN
  Filled 2023-02-20: qty 75, 7d supply, fill #0
  Filled 2023-08-20 – 2023-10-29 (×2): qty 75, 7d supply, fill #1

## 2023-02-20 MED ORDER — ALBUTEROL SULFATE HFA 108 (90 BASE) MCG/ACT IN AERS
2.0000 | INHALATION_SPRAY | RESPIRATORY_TRACT | Status: DC | PRN
  Administered 2023-02-20: 2 via RESPIRATORY_TRACT

## 2023-02-20 MED ORDER — PREDNISONE 50 MG PO TABS
60.0000 mg | ORAL_TABLET | Freq: Once | ORAL | Status: AC
  Administered 2023-02-20: 60 mg via ORAL
  Filled 2023-02-20: qty 1

## 2023-02-20 MED ORDER — ALBUTEROL SULFATE HFA 108 (90 BASE) MCG/ACT IN AERS
2.0000 | INHALATION_SPRAY | RESPIRATORY_TRACT | 0 refills | Status: DC | PRN
  Filled 2023-02-20: qty 6.7, 25d supply, fill #0

## 2023-02-20 MED ORDER — IPRATROPIUM-ALBUTEROL 0.5-2.5 (3) MG/3ML IN SOLN
RESPIRATORY_TRACT | Status: AC
  Administered 2023-02-20: 3 mL via RESPIRATORY_TRACT
  Filled 2023-02-20: qty 3

## 2023-02-20 MED ORDER — ALBUTEROL SULFATE HFA 108 (90 BASE) MCG/ACT IN AERS
INHALATION_SPRAY | RESPIRATORY_TRACT | Status: AC
  Filled 2023-02-20: qty 6.7

## 2023-02-20 NOTE — ED Provider Notes (Signed)
[place] EMERGENCY DEPARTMENT AT [facility] Provider Note   CSN: [id number] Arrival date & time: 02/20/23  1107     History  Chief Complaint  Patient presents with   Asthma    [name] is a 42 y.o. male.  Patient is a 42 year old male with a past medical history of asthma presenting to the emergency department for cough.  Patient states that he has had increasing cough with chest tightness since Sunday and feels like his asthma exacerbation.  He states that he did run out of his inhalers at home.  He denies any fevers, congestion or sore throat.  The history is provided by the patient.  Asthma       Home Medications Prior to Admission medications   Medication Sig Start Date End Date Taking? Authorizing Provider  albuterol (PROVENTIL) (2.5 MG/3ML) 0.083% nebulizer solution Take 3 mLs (2.5 mg total) by nebulization every 6 (six) hours as needed for wheezing or shortness of breath. 02/20/23  Yes Theresia Lo, Wynn Kernes K, DO  albuterol (VENTOLIN HFA) 108 (90 Base) MCG/ACT inhaler Inhale 2 puffs into the lungs every 4 (four) hours as needed for wheezing or shortness of breath. 02/20/23  Yes Elayne Snare K, DO  nirmatrelvir & ritonavir (PAXLOVID, 300/100,) 20 x 150 MG & 10 x 100MG  TBPK Take 3 tablets by mouth 2 (two) times daily for 5 days. 02/20/23 02/25/23 Yes Theresia Lo, Benetta Spar K, DO  predniSONE (DELTASONE) 10 MG tablet Take 4 tablets (40 mg total) by mouth daily for 4 days. 02/20/23 02/24/23 Yes Theresia Lo, Benetta Spar K, DO  cetirizine (ZYRTEC) 10 MG tablet Take 10 mg by mouth daily as needed for allergies.    [provider]      Allergies    Patient has no known allergies.    Review of Systems   Review of Systems  Physical Exam Updated Vital Signs BP (!) 140/76   Pulse 76   Temp 98.2 F (36.8 C) (Temporal)   Resp (!) 22   Ht 5\' 11"  (1.803 m)   Wt 117.9 kg   SpO2 98%   BMI 36.26 kg/m  Physical Exam Vitals and nursing note reviewed.   Constitutional:      General: He is not in acute distress.    Appearance: Normal appearance.  HENT:     Head: Normocephalic and atraumatic.     Nose: Nose normal.     Mouth/Throat:     Mouth: Mucous membranes are moist.     Pharynx: Oropharynx is clear.  Eyes:     Extraocular Movements: Extraocular movements intact.     Conjunctiva/sclera: Conjunctivae normal.  Cardiovascular:     Rate and Rhythm: Normal rate and regular rhythm.     Heart sounds: Normal heart sounds.  Pulmonary:     Effort: Pulmonary effort is normal.     Breath sounds: Wheezing (Diffuse expiratory wheeze) present.  Abdominal:     General: Abdomen is flat.     Palpations: Abdomen is soft.  Musculoskeletal:        General: Normal range of motion.     Cervical back: Normal range of motion.  Skin:    General: Skin is warm and dry.  Neurological:     General: No focal deficit present.     Mental Status: He is alert and oriented to person, place, and time.  Psychiatric:        Mood and Affect: Mood normal.        Behavior: Behavior normal.  ED Results / Procedures / Treatments   Labs (all labs ordered are listed, but only abnormal results are displayed) Labs Reviewed  SARS CORONAVIRUS 2 BY RT PCR - Abnormal; Notable for the following components:      Result Value   SARS Coronavirus 2 by RT PCR POSITIVE (*)    All other components within normal limits    EKG EKG Interpretation Date/Time:  Tuesday February 20 2023 11:21:47 EDT Ventricular Rate:  86 PR Interval:  182 QRS Duration:  78 QT Interval:  362 QTC Calculation: 433 R Axis:   92  Text Interpretation: Sinus rhythm with occasional Premature ventricular complexes Rightward axis Low voltage QRS Borderline ECG  Occasional PVCs otherwise unchanged from prior EKG Confirmed by Elayne Snare ([id number]) on 02/20/2023 11:47:18 AM  Radiology No results found.  Procedures Procedures    Medications Ordered in ED Medications  albuterol (VENTOLIN  HFA) 108 (90 Base) MCG/ACT inhaler 2 puff (2 puffs Inhalation Given 02/20/23 1303)  AeroChamber Plus Flo-Vu Medium MISC 1 each (has no administration in time range)  albuterol (PROVENTIL) (2.5 MG/3ML) 0.083% nebulizer solution 2.5 mg (2.5 mg Nebulization Given 02/20/23 1147)  ipratropium-albuterol (DUONEB) 0.5-2.5 (3) MG/3ML nebulizer solution 3 mL (3 mLs Nebulization Given 02/20/23 1147)  predniSONE (DELTASONE) tablet 60 mg (60 mg Oral Given 02/20/23 1302)    ED Course/ Medical Decision Making/ A&P                                 Medical Decision Making This patient presents to the ED with chief complaint(s) of cough with pertinent past medical history of asthma which further complicates the presenting complaint. The complaint involves an extensive differential diagnosis and also carries with it a high risk of complications and morbidity.    The differential diagnosis includes asthma exacerbation, viral syndrome, no focal lung sounds and satting well on room air making pneumonia unlikely  Additional history obtained: Additional history obtained from N/A Records reviewed N/A  ED Course and Reassessment: On patient's arrival he is hemodynamically stable in no acute distress.  He is initially evaluated by triage and had EKG and COVID swab performed and was started on DuoNeb and albuterol neb for his wheezing.  Patient's EKG showed normal sinus rhythm without acute ischemic changes.  COVID swab was positive.  The patient reports some improvement of his symptoms after albuterol and DuoNeb however still is wheezing on exam.  He will be additionally given steroids and additional puffs of albuterol.  He was recommended Paxil bid with his history of asthma for his COVID.  He is otherwise stable for discharge home with outpatient follow-up and was given strict return precautions.  Independent labs interpretation:  The following labs were independently interpreted: COVID-positive  Independent  visualization of imaging: - N/A  Consultation: - Consulted or discussed management/test interpretation w/ external professional: N/A  Consideration for admission or further workup: Patient has no emergent conditions requiring admission or further work-up at this time and is stable for discharge home with primary care follow-up  Social Determinants of health: Uninsured    Risk Prescription drug management.          Final Clinical Impression(s) / ED Diagnoses Final diagnoses:  Mild intermittent asthma with exacerbation  COVID-19    Rx / DC Orders ED Discharge Orders          Ordered    predniSONE (DELTASONE) 10 MG tablet  Daily  02/20/23 1325    albuterol (PROVENTIL) (2.5 MG/3ML) 0.083% nebulizer solution  Every 6 hours PRN        02/20/23 1325    albuterol (VENTOLIN HFA) 108 (90 Base) MCG/ACT inhaler  Every 4 hours PRN        02/20/23 1325    nirmatrelvir & ritonavir (PAXLOVID, 300/100,) 20 x 150 MG & 10 x 100MG  TBPK  2 times daily        02/20/23 1325              [place], [place] K, DO 02/20/23 1326

## 2023-02-20 NOTE — ED Triage Notes (Signed)
Pt to ED c/o asthma, hx asthma- doesn't have an inhaler x 1 week. Currently denies [facility]. A&O X 4 in triage. No s/s of acute distress noted. Also reports cough.

## 2023-02-20 NOTE — Discharge Instructions (Addendum)
You were seen in the emergency department for your cough and chest tightness.  You did appear to have an asthma exacerbation and your COVID test was positive which is likely causing your exacerbation.  I have given you 4 more days of steroids and you should complete this as prescribed.  You can continue to take your albuterol every 4 hours for the next 24 hours and then can use as needed.  I have also given you Paxlovid which is the treatment for COVID and you should complete this as prescribed to prevent worsening of your COVID infection.  You can follow-up with your primary doctor to have your symptoms rechecked.  You should return to the emergency department for significantly worsening shortness of breath, severe chest pain or any other new or concerning symptoms.

## 2023-03-02 ENCOUNTER — Other Ambulatory Visit (HOSPITAL_BASED_OUTPATIENT_CLINIC_OR_DEPARTMENT_OTHER): Payer: Self-pay

## 2023-03-17 ENCOUNTER — Other Ambulatory Visit: Payer: Self-pay

## 2023-03-17 ENCOUNTER — Emergency Department (HOSPITAL_BASED_OUTPATIENT_CLINIC_OR_DEPARTMENT_OTHER): Payer: MEDICAID

## 2023-03-17 ENCOUNTER — Emergency Department (HOSPITAL_BASED_OUTPATIENT_CLINIC_OR_DEPARTMENT_OTHER)
Admission: EM | Admit: 2023-03-17 | Discharge: 2023-03-17 | Disposition: A | Discharge status: Home / Self Care | Payer: MEDICAID | Attending: Emergency Medicine | Admitting: Emergency Medicine

## 2023-03-17 DIAGNOSIS — J4541 Moderate persistent asthma with (acute) exacerbation: Secondary | ICD-10-CM | POA: Insufficient documentation

## 2023-03-17 DIAGNOSIS — Z1152 Encounter for screening for COVID-19: Secondary | ICD-10-CM | POA: Insufficient documentation

## 2023-03-17 DIAGNOSIS — R0602 Shortness of breath: Secondary | ICD-10-CM | POA: Diagnosis present

## 2023-03-17 LAB — CBC WITH DIFFERENTIAL/PLATELET
Abs Immature Granulocytes: 0.03 10*3/uL (ref 0.00–0.07)
Basophils Absolute: 0.1 10*3/uL (ref 0.0–0.1)
Basophils Relative: 1 %
Eosinophils Absolute: 0.4 10*3/uL (ref 0.0–0.5)
Eosinophils Relative: 4 %
HCT: 37.8 % — ABNORMAL LOW (ref 39.0–52.0)
Hemoglobin: 12.9 g/dL — ABNORMAL LOW (ref 13.0–17.0)
Immature Granulocytes: 0 %
Lymphocytes Relative: 17 %
Lymphs Abs: 1.7 10*3/uL (ref 0.7–4.0)
MCH: 32.1 pg (ref 26.0–34.0)
MCHC: 34.1 g/dL (ref 30.0–36.0)
MCV: 94 fL (ref 80.0–100.0)
Monocytes Absolute: 0.8 10*3/uL (ref 0.1–1.0)
Monocytes Relative: 8 %
Neutro Abs: 7.4 10*3/uL (ref 1.7–7.7)
Neutrophils Relative %: 70 %
Platelets: 283 10*3/uL (ref 150–400)
RBC: 4.02 MIL/uL — ABNORMAL LOW (ref 4.22–5.81)
RDW: 12.8 % (ref 11.5–15.5)
WBC: 10.5 10*3/uL (ref 4.0–10.5)
nRBC: 0 % (ref 0.0–0.2)

## 2023-03-17 LAB — RESP PANEL BY RT-PCR (RSV, FLU A&B, COVID)  RVPGX2
Influenza A by PCR: NEGATIVE
Influenza B by PCR: NEGATIVE
Resp Syncytial Virus by PCR: NEGATIVE
SARS Coronavirus 2 by RT PCR: NEGATIVE

## 2023-03-17 LAB — BASIC METABOLIC PANEL
Anion gap: 8 (ref 5–15)
BUN: 18 mg/dL (ref 6–20)
CO2: 28 mmol/L (ref 22–32)
Calcium: 8.8 mg/dL — ABNORMAL LOW (ref 8.9–10.3)
Chloride: 102 mmol/L (ref 98–111)
Creatinine, Ser: 0.86 mg/dL (ref 0.61–1.24)
GFR, Estimated: >60 mL/min (ref >60)
Glucose, Bld: 131 mg/dL — ABNORMAL HIGH (ref 70–99)
Potassium: 4 mmol/L (ref 3.5–5.1)
Sodium: 138 mmol/L (ref 135–145)

## 2023-03-17 MED ORDER — ALBUTEROL SULFATE (2.5 MG/3ML) 0.083% IN NEBU: 5.0000 mg | INHALATION_SOLUTION | Freq: Once | RESPIRATORY_TRACT | Status: DC

## 2023-03-17 MED ORDER — IPRATROPIUM-ALBUTEROL 0.5-2.5 (3) MG/3ML IN SOLN: 3.0000 mL | Freq: Once | RESPIRATORY_TRACT | Status: AC

## 2023-03-17 MED ORDER — METHYLPREDNISOLONE SODIUM SUCC 125 MG IJ SOLR
125.0000 mg | Freq: Once | INTRAMUSCULAR | Status: AC
  Administered 2023-03-17: 125 mg via INTRAVENOUS

## 2023-03-17 MED ORDER — IPRATROPIUM-ALBUTEROL 0.5-2.5 (3) MG/3ML IN SOLN: 3.0000 mL | Freq: Once | RESPIRATORY_TRACT | Status: DC

## 2023-03-17 MED ORDER — ALBUTEROL SULFATE HFA 108 (90 BASE) MCG/ACT IN AERS
2.0000 | INHALATION_SPRAY | RESPIRATORY_TRACT | Status: DC | PRN
  Administered 2023-03-17: 2 via RESPIRATORY_TRACT
  Filled 2023-03-17: qty 6.7

## 2023-03-17 MED ORDER — IPRATROPIUM-ALBUTEROL 0.5-2.5 (3) MG/3ML IN SOLN
3.0000 mL | Freq: Once | RESPIRATORY_TRACT | Status: AC
  Administered 2023-03-17: 3 mL via RESPIRATORY_TRACT
  Filled 2023-03-17: qty 3

## 2023-03-17 MED ORDER — ALBUTEROL (5 MG/ML) CONTINUOUS INHALATION SOLN: 15.0000 mg/h | INHALATION_SOLUTION | Freq: Once | RESPIRATORY_TRACT | Status: DC

## 2023-03-17 MED ORDER — ALBUTEROL SULFATE (2.5 MG/3ML) 0.083% IN NEBU
2.5000 mg | INHALATION_SOLUTION | Freq: Once | RESPIRATORY_TRACT | Status: AC
  Administered 2023-03-17: 2.5 mg via RESPIRATORY_TRACT
  Filled 2023-03-17: qty 3

## 2023-03-17 MED ORDER — IPRATROPIUM BROMIDE 0.02 % IN SOLN: 0.5000 mg | Freq: Once | RESPIRATORY_TRACT | Status: DC

## 2023-03-17 MED ORDER — IPRATROPIUM BROMIDE 0.02 % IN SOLN: 1.0000 mg | Freq: Once | RESPIRATORY_TRACT | Status: DC

## 2023-03-17 MED ORDER — ALBUTEROL SULFATE (2.5 MG/3ML) 0.083% IN NEBU: 2.5000 mg | INHALATION_SOLUTION | Freq: Once | RESPIRATORY_TRACT | Status: AC

## 2023-03-17 MED ORDER — ALBUTEROL SULFATE (2.5 MG/3ML) 0.083% IN NEBU: 2.5000 mg | INHALATION_SOLUTION | Freq: Once | RESPIRATORY_TRACT | Status: DC

## 2023-03-17 MED ORDER — MAGNESIUM SULFATE 2 GM/50ML IV SOLN
2.0000 g | Freq: Once | INTRAVENOUS | Status: AC
  Administered 2023-03-17: 2 g via INTRAVENOUS
  Filled 2023-03-17: qty 50

## 2023-03-17 MED ORDER — ALBUTEROL SULFATE (2.5 MG/3ML) 0.083% IN NEBU
2.5000 mg | INHALATION_SOLUTION | Freq: Four times a day (QID) | RESPIRATORY_TRACT | 0 refills | Status: DC | PRN
  Filled 2023-03-17: qty 75, 7d supply, fill #0

## 2023-03-17 MED ORDER — ALBUTEROL SULFATE (2.5 MG/3ML) 0.083% IN NEBU
INHALATION_SOLUTION | RESPIRATORY_TRACT | Status: AC
  Administered 2023-03-17: 2.5 mg via RESPIRATORY_TRACT
  Filled 2023-03-17: qty 3

## 2023-03-17 MED ORDER — METHYLPREDNISOLONE SODIUM SUCC 125 MG IJ SOLR
125.0000 mg | Freq: Once | INTRAMUSCULAR | Status: DC
  Filled 2023-03-17: qty 2

## 2023-03-17 MED ORDER — IPRATROPIUM-ALBUTEROL 0.5-2.5 (3) MG/3ML IN SOLN
RESPIRATORY_TRACT | Status: AC
  Administered 2023-03-17: 3 mL via RESPIRATORY_TRACT
  Filled 2023-03-17: qty 3

## 2023-03-17 MED ORDER — PREDNISONE 20 MG PO TABS
ORAL_TABLET | ORAL | 0 refills | Status: AC
  Filled 2023-03-17 – 2023-03-19 (×2): qty 12, 6d supply, fill #0

## 2023-03-17 MED ORDER — AEROCHAMBER PLUS FLO-VU MISC
1.0000 | Freq: Once | Status: AC
  Administered 2023-03-17: 1
  Filled 2023-03-17: qty 1

## 2023-03-17 NOTE — Discharge Instructions (Addendum)
You likely have COPD or asthma.  Please stop smoking.  Take prednisone as prescribed  Use albuterol every 4 hours as needed for cough or shortness of breath  You need to follow-up with your doctor  Return to ER if you have worse shortness of breath or cough or trouble breathing

## 2023-03-17 NOTE — ED Triage Notes (Signed)
Pt c/o asthma exacerbation, states he's been out of inhaler, "medication." Seen 8/20, dx w covid, "I think I got better but now I just need more of my meds." Pt noted to be wheezing in triage, unable to speak in complete sentences

## 2023-03-17 NOTE — ED Notes (Signed)
Attempted to get EKG. Pt unable to sit still at this time.

## 2023-03-17 NOTE — ED Provider Notes (Signed)
[facility] Provider Note   CSN: [id number] Arrival date & time: 03/17/23  1951     History  Chief Complaint  Patient presents with   Shortness of Breath    Ronald Roberson is a 42 y.o. male history of asthma, smoker here presenting with cough and shortness of breath.  Patient states that he was diagnosed with COVID about 3 weeks ago.  Patient states that at that time he was treated with some albuterol and steroids.  Patient states that he was doing well until several days ago.  He started having nonproductive cough.  He also had some chills.   The history is provided by the patient.       Home Medications Prior to Admission medications   Medication Sig Start Date End Date Taking? Authorizing Provider  albuterol (PROVENTIL) (2.5 MG/3ML) 0.083% nebulizer solution Take 3 mLs (2.5 mg total) by nebulization every 6 (six) hours as needed for wheezing or shortness of breath. 02/20/23   Elayne Snare K, DO  albuterol (VENTOLIN HFA) 108 (90 Base) MCG/ACT inhaler Inhale 2 puffs into the lungs every 4 (four) hours as needed for wheezing or shortness of breath. 02/20/23   Elayne Snare K, DO  cetirizine (ZYRTEC) 10 MG tablet Take 10 mg by mouth daily as needed for allergies.    [provider]      Allergies    Patient has no known allergies.    Review of Systems   Review of Systems  Respiratory:  Positive for cough and shortness of breath.   All other systems reviewed and are negative.   Physical Exam Updated Vital Signs BP 112/84   Pulse (!) 102   Temp 98.4 F (36.9 C)   Resp (!) 22   SpO2 93%  Physical Exam Vitals and nursing note reviewed.  Constitutional:      Comments: Tachypneic and coughing  HENT:     Head: Normocephalic.     Mouth/Throat:     Mouth: Mucous membranes are moist.  Eyes:     Extraocular Movements: Extraocular movements intact.     Pupils: Pupils are equal, round, and reactive to light.   Cardiovascular:     Rate and Rhythm: Regular rhythm. Tachycardia present.  Pulmonary:     Comments: Tachypneic and diffuse wheezing with mild retractions Abdominal:     General: Bowel sounds are normal.     Palpations: Abdomen is soft.  Musculoskeletal:        General: Normal range of motion.     Cervical back: Normal range of motion and neck supple.  Skin:    General: Skin is warm.     Capillary Refill: Capillary refill takes less than 2 seconds.  Neurological:     General: No focal deficit present.     Mental Status: He is oriented to person, place, and time.  Psychiatric:        Mood and Affect: Mood normal.        Behavior: Behavior normal.     ED Results / Procedures / Treatments   Labs (all labs ordered are listed, but only abnormal results are displayed) Labs Reviewed  CBC WITH DIFFERENTIAL/PLATELET - Abnormal; Notable for the following components:      Result Value   RBC 4.02 (*)    Hemoglobin 12.9 (*)    HCT 37.8 (*)    All other components within normal limits  RESP PANEL BY RT-PCR (RSV, FLU A&B, COVID)  [id number]  BASIC METABOLIC PANEL    EKG EKG Interpretation Date/Time:  Saturday March 17 2023 20:23:48 EDT Ventricular Rate:  105 PR Interval:  164 QRS Duration:  82 QT Interval:  317 QTC Calculation: 419 R Axis:   101  Text Interpretation: Sinus tachycardia Right axis deviation Low voltage, precordial leads No significant change since last tracing Confirmed by Richardean Canal ([id number]) on 03/17/2023 8:25:19 PM  Radiology DG Chest Port 1 View  Result Date: 03/17/2023 CLINICAL DATA:  Chest pain EXAM: PORTABLE CHEST 1 VIEW COMPARISON:  07/11/2019 FINDINGS: Cardiac shadow is within normal limits. Lungs are well aerated bilaterally. No focal infiltrate or effusion is seen. No bony abnormality is noted. IMPRESSION: No active disease. Electronically Signed   By: Alcide Clever M.D.   On: 03/17/2023 20:29    Procedures Procedures    Medications Ordered in  ED Medications  albuterol (VENTOLIN HFA) 108 (90 Base) MCG/ACT inhaler 2 puff (has no administration in time range)  methylPREDNISolone sodium succinate (SOLU-MEDROL) 125 mg/2 mL injection 125 mg (125 mg Intravenous Not Given 03/17/23 2049)  magnesium sulfate IVPB 2 g 50 mL (2 g Intravenous New Bag/Given 03/17/23 2103)  aerochamber plus with mask device 1 each (1 each Other Given 03/17/23 2048)  ipratropium-albuterol (DUONEB) 0.5-2.5 (3) MG/3ML nebulizer solution 3 mL (3 mLs Nebulization Given 03/17/23 2048)  albuterol (PROVENTIL) (2.5 MG/3ML) 0.083% nebulizer solution 2.5 mg (2.5 mg Nebulization Given 03/17/23 2048)  methylPREDNISolone sodium succinate (SOLU-MEDROL) 125 mg/2 mL injection 125 mg (125 mg Intravenous Given 03/17/23 2100)    ED Course/ Medical Decision Making/ A&P                                 Medical Decision Making Ronald Roberson is a 42 y.o. male here presenting with cough and wheezing.  I think likely asthma exacerbation versus pneumonia versus COVID.  Plan to get CBC and CMP and chest x-ray and COVID test.  Will give Solu-Medrol and magnesium and DuoNeb and reassess  10:32 PM I reviewed patient's labs and independently interpreted imaging studies.  Labs are unremarkable and COVID and flu and RSV negative.  Chest x-ray is clear.  Patient is breathing more comfortable now.  Patient has less wheezing and improved air movement.  At this point patient is stable for discharge.  Told him to stop smoking and will prescribe a course of steroids and patient was given albuterol  Problems Addressed: Moderate persistent asthma with exacerbation: acute illness or injury  Amount and/or Complexity of Data Reviewed Labs: ordered. Decision-making details documented in ED Course. Radiology: ordered and independent interpretation performed. Decision-making details documented in ED Course.  Risk Prescription drug management.  Final Clinical Impression(s) / ED Diagnoses Final diagnoses:   None    Rx / DC Orders ED Discharge Orders     None         Charlynne Pander, MD 03/17/23 2233

## 2023-03-17 NOTE — ED Notes (Signed)
Pt educated on proper use of MDI w/spacer. Pt able to perform and teach back w/out difficulty.  RT educated pt on smoking cessation at this time as well. Pt verbalizes understanding.    03/17/23 2234  Aerosol Therapy Tx  $ Hand Held Nebulizer  1  Medications Albuterol  Delivery Device MDI (w/spacer)  Pre-Treatment Pulse 102  Pre-Treatment Respirations 21  Treatment Tolerance Tolerated well  Treatment Given 1

## 2023-03-18 ENCOUNTER — Other Ambulatory Visit (HOSPITAL_BASED_OUTPATIENT_CLINIC_OR_DEPARTMENT_OTHER): Payer: Self-pay

## 2023-03-19 ENCOUNTER — Other Ambulatory Visit (HOSPITAL_BASED_OUTPATIENT_CLINIC_OR_DEPARTMENT_OTHER): Payer: Self-pay

## 2023-03-19 ENCOUNTER — Other Ambulatory Visit: Payer: Self-pay

## 2023-03-20 ENCOUNTER — Other Ambulatory Visit: Payer: Self-pay

## 2023-04-06 ENCOUNTER — Other Ambulatory Visit (HOSPITAL_COMMUNITY): Payer: Self-pay

## 2023-04-18 ENCOUNTER — Other Ambulatory Visit (HOSPITAL_BASED_OUTPATIENT_CLINIC_OR_DEPARTMENT_OTHER): Payer: Self-pay

## 2023-05-18 ENCOUNTER — Encounter (HOSPITAL_BASED_OUTPATIENT_CLINIC_OR_DEPARTMENT_OTHER): Payer: Self-pay | Admitting: Emergency Medicine

## 2023-05-18 ENCOUNTER — Other Ambulatory Visit: Payer: Self-pay

## 2023-05-18 ENCOUNTER — Emergency Department (HOSPITAL_BASED_OUTPATIENT_CLINIC_OR_DEPARTMENT_OTHER)
Admission: EM | Admit: 2023-05-18 | Discharge: 2023-05-18 | Disposition: A | Discharge status: Home / Self Care | Payer: MEDICAID

## 2023-05-18 DIAGNOSIS — Z7951 Long term (current) use of inhaled steroids: Secondary | ICD-10-CM | POA: Insufficient documentation

## 2023-05-18 DIAGNOSIS — L0231 Cutaneous abscess of buttock: Secondary | ICD-10-CM | POA: Diagnosis present

## 2023-05-18 DIAGNOSIS — R0602 Shortness of breath: Secondary | ICD-10-CM | POA: Insufficient documentation

## 2023-05-18 DIAGNOSIS — J45909 Unspecified asthma, uncomplicated: Secondary | ICD-10-CM | POA: Insufficient documentation

## 2023-05-18 DIAGNOSIS — L0291 Cutaneous abscess, unspecified: Secondary | ICD-10-CM

## 2023-05-18 MED ORDER — LIDOCAINE-EPINEPHRINE 2 %-1:100000 IJ SOLN: 20.0000 mL | Freq: Once | INTRAMUSCULAR | Status: DC

## 2023-05-18 MED ORDER — LIDOCAINE-EPINEPHRINE 1 %-1:100000 IJ SOLN
20.0000 mL | Freq: Once | INTRAMUSCULAR | Status: AC
  Administered 2023-05-18: 20 mL
  Filled 2023-05-18: qty 1

## 2023-05-18 MED ORDER — ALBUTEROL SULFATE HFA 108 (90 BASE) MCG/ACT IN AERS
2.0000 | INHALATION_SPRAY | Freq: Four times a day (QID) | RESPIRATORY_TRACT | Status: DC | PRN
  Administered 2023-05-18: 2 via RESPIRATORY_TRACT
  Filled 2023-05-18: qty 6.7

## 2023-05-18 MED ORDER — SULFAMETHOXAZOLE-TRIMETHOPRIM 800-160 MG PO TABS: 1.0000 | ORAL_TABLET | Freq: Two times a day (BID) | ORAL | 0 refills | Status: AC

## 2023-05-18 MED ORDER — ALBUTEROL SULFATE (2.5 MG/3ML) 0.083% IN NEBU
2.5000 mg | INHALATION_SOLUTION | Freq: Once | RESPIRATORY_TRACT | Status: AC
  Administered 2023-05-18: 2.5 mg via RESPIRATORY_TRACT
  Filled 2023-05-18: qty 3

## 2023-05-18 NOTE — ED Notes (Signed)
RT Note: Patient wanted to know if he could get an Albuterol inhaler while he was here . He has HX Asthma. Upon assessing this patient he had a congested cough. Patient stated he was a little SOB. Inhaler ordered for patient

## 2023-05-18 NOTE — Discharge Instructions (Addendum)
Take antibiotics as prescribed for the full course.  Return to the emergency department immediately if develop fevers, chills, worsening pain, redness spreads towards your perineum, testicles or penis.  You may also return if develop any new or worsening symptoms that are concerning to you.  Your wound will continue to drain for the next several days.  Please keep the area clean and dry and change bandage daily. You may shower normally.  Please do not submerge yourself in water for the next 5 days.

## 2023-05-18 NOTE — ED Notes (Signed)
Pt stated that the nebulizer treatment helped. Pt currently resting. NAD. O2 sat 98% on room air.

## 2023-05-18 NOTE — ED Notes (Signed)
Reviewed discharge instructions, medications, and home care with pt. Pt verbalized understanding and had no further questions. Pt exited ED without complications.

## 2023-05-18 NOTE — ED Notes (Signed)
Pt noted to appear SOB while resting in bed. RT asked to assess pt.

## 2023-05-18 NOTE — ED Provider Notes (Signed)
[place] EMERGENCY DEPARTMENT AT [facility] Provider Note   CSN: [id number] Arrival date & time: 05/18/23  1428     History  Chief Complaint  Patient presents with   Cyst    Ronald Roberson is a 42 y.o. male.  42 year old male present emergency department for bump on his buttocks.  Reportedly noticing a lump yesterday.  Has grown throughout the day today.  No fevers chills.  Reports history of abscess.  Also requesting albuterol inhaler as he has history of asthma and has run out of his prior medication.  Mild shortness of breath.        Home Medications Prior to Admission medications   Medication Sig Start Date End Date Taking? Authorizing Provider  albuterol (PROVENTIL) (2.5 MG/3ML) 0.083% nebulizer solution Take 3 mLs (2.5 mg total) by nebulization every 6 (six) hours as needed for wheezing or shortness of breath. 02/20/23   Elayne Snare K, DO  albuterol (PROVENTIL) (2.5 MG/3ML) 0.083% nebulizer solution Take 3 mLs (2.5 mg total) by nebulization every 6 (six) hours as needed for wheezing or shortness of breath. 03/17/23   Charlynne Pander, MD  albuterol (VENTOLIN HFA) 108 (90 Base) MCG/ACT inhaler Inhale 2 puffs into the lungs every 4 (four) hours as needed for wheezing or shortness of breath. 02/20/23   Elayne Snare K, DO  cetirizine (ZYRTEC) 10 MG tablet Take 10 mg by mouth daily as needed for allergies.    [provider]      Allergies    Patient has no known allergies.    Review of Systems   Review of Systems  Physical Exam Updated Vital Signs BP 129/86   Pulse 90   Temp (!) 97.3 F (36.3 C)   Resp 18   Ht 5\' 11"  (1.803 m)   Wt 124.3 kg   SpO2 94%   BMI 38.22 kg/m  Physical Exam Vitals and nursing note reviewed.  Constitutional:      General: He is not in acute distress. HENT:     Head: Normocephalic.     Nose: Nose normal.     Mouth/Throat:     Mouth: Mucous membranes are moist.  Eyes:     Conjunctiva/sclera:  Conjunctivae normal.  Cardiovascular:     Rate and Rhythm: Normal rate.  Pulmonary:     Effort: Pulmonary effort is normal.     Breath sounds: Normal breath sounds.  Abdominal:     General: Abdomen is flat. There is no distension.     Tenderness: There is no abdominal tenderness.  Genitourinary:    Penis: Normal.      Testes: Normal.     Comments: 4cm area of induration and fluctuance to right buttocks.  Skin:    General: Skin is warm and dry.     Capillary Refill: Capillary refill takes less than 2 seconds.  Neurological:     Mental Status: He is alert and oriented to person, place, and time.  Psychiatric:        Mood and Affect: Mood normal.        Behavior: Behavior normal.     ED Results / Procedures / Treatments   Labs (all labs ordered are listed, but only abnormal results are displayed) Labs Reviewed - No data to display  EKG None  Radiology No results found.  Procedures .Marland KitchenIncision and Drainage  Date/Time: 05/18/2023 6:14 PM  Performed by: Coral Spikes, DO Authorized by: Coral Spikes, DO   Consent:  Consent obtained:  Verbal   Consent given by:  Patient   Risks discussed:  Bleeding, incomplete drainage, pain and infection   Alternatives discussed:  No treatment and delayed treatment Universal protocol:    Patient identity confirmed:  Verbally with patient Location:    Type:  Abscess   Size:  4   Location:  Lower extremity   Lower extremity location:  Buttock   Buttock location:  R buttock Pre-procedure details:    Skin preparation:  Antiseptic wash Sedation:    Sedation type:  None Anesthesia:    Anesthesia method:  Local infiltration   Local anesthetic:  Lidocaine 1% WITH epi Procedure type:    Complexity:  Simple Procedure details:    Ultrasound guidance: no     Incision types:  Stab incision   Incision depth:  Dermal   Wound management:  Probed and deloculated, irrigated with saline and extensive cleaning   Drainage:  Bloody and  purulent   Drainage amount:  Moderate   Wound treatment:  Wound left open Post-procedure details:    Procedure completion:  Tolerated     Medications Ordered in ED Medications  albuterol (VENTOLIN HFA) 108 (90 Base) MCG/ACT inhaler 2 puff (2 puffs Inhalation Provided for home use 05/18/23 1739)  albuterol (PROVENTIL) (2.5 MG/3ML) 0.083% nebulizer solution 2.5 mg (2.5 mg Nebulization Given 05/18/23 1739)  lidocaine-EPINEPHrine (XYLOCAINE W/EPI) 1 %-1:100000 (with pres) injection 20 mL (20 mLs Infiltration Given 05/18/23 1744)    ED Course/ Medical Decision Making/ A&P                                 Medical Decision Making 42 year old male presented with abscess to his right buttock.  Afebrile vital signs reassuring.  Low suspicion for systemic infection.  Lungs clear.  Does not appear to be in respiratory distress.  Albuterol refilled.  Will discharge with antibiotics.  Consider labs however with superficial abscess with no concern for deep space or systemic infection.  Labs unlikely to change management or disposition at this time.  Patient given strict return precautions and follow-up primary doctor.  Risk Prescription drug management.          Final Clinical Impression(s) / ED Diagnoses Final diagnoses:  None    Rx / DC Orders ED Discharge Orders     None         Coral Spikes, DO 05/18/23 1821

## 2023-05-18 NOTE — ED Triage Notes (Signed)
Pt arrives from home, POV with c/o "knot" on R butt cheek at fold area. Noticed it yesterday. Not painful, uncomfortable. Pain 0/10  Also asking for inhaler refill

## 2023-05-22 ENCOUNTER — Other Ambulatory Visit (HOSPITAL_COMMUNITY): Payer: Self-pay

## 2023-06-04 ENCOUNTER — Emergency Department (HOSPITAL_BASED_OUTPATIENT_CLINIC_OR_DEPARTMENT_OTHER)
Admission: EM | Admit: 2023-06-04 | Discharge: 2023-06-04 | Disposition: A | Discharge status: Home / Self Care | Payer: MEDICAID | Attending: Emergency Medicine | Admitting: Emergency Medicine

## 2023-06-04 ENCOUNTER — Other Ambulatory Visit: Payer: Self-pay

## 2023-06-04 ENCOUNTER — Other Ambulatory Visit (HOSPITAL_BASED_OUTPATIENT_CLINIC_OR_DEPARTMENT_OTHER): Payer: Self-pay

## 2023-06-04 ENCOUNTER — Encounter (HOSPITAL_BASED_OUTPATIENT_CLINIC_OR_DEPARTMENT_OTHER): Payer: Self-pay | Admitting: Emergency Medicine

## 2023-06-04 DIAGNOSIS — R0602 Shortness of breath: Secondary | ICD-10-CM | POA: Diagnosis present

## 2023-06-04 DIAGNOSIS — Z7951 Long term (current) use of inhaled steroids: Secondary | ICD-10-CM | POA: Diagnosis not present

## 2023-06-04 DIAGNOSIS — J4541 Moderate persistent asthma with (acute) exacerbation: Secondary | ICD-10-CM | POA: Diagnosis not present

## 2023-06-04 DIAGNOSIS — Z87891 Personal history of nicotine dependence: Secondary | ICD-10-CM | POA: Insufficient documentation

## 2023-06-04 HISTORY — DX: Unspecified asthma, uncomplicated: J45.909

## 2023-06-04 MED ORDER — IPRATROPIUM-ALBUTEROL 0.5-2.5 (3) MG/3ML IN SOLN
3.0000 mL | RESPIRATORY_TRACT | 0 refills | Status: AC | PRN
Start: 2023-06-04 — End: ?
  Filled 2023-06-04: qty 360, 30d supply, fill #0

## 2023-06-04 MED ORDER — PREDNISONE 50 MG PO TABS
60.0000 mg | ORAL_TABLET | Freq: Once | ORAL | Status: AC
  Administered 2023-06-04: 60 mg via ORAL
  Filled 2023-06-04: qty 1

## 2023-06-04 MED ORDER — AEROCHAMBER PLUS FLO-VU SMALL MISC
1.0000 | Freq: Once | Status: AC
  Administered 2023-06-04: 1
  Filled 2023-06-04: qty 1

## 2023-06-04 MED ORDER — IPRATROPIUM-ALBUTEROL 0.5-2.5 (3) MG/3ML IN SOLN
3.0000 mL | Freq: Once | RESPIRATORY_TRACT | Status: AC
  Administered 2023-06-04: 3 mL via RESPIRATORY_TRACT
  Filled 2023-06-04: qty 3

## 2023-06-04 MED ORDER — PREDNISONE 10 MG PO TABS
40.0000 mg | ORAL_TABLET | Freq: Every day | ORAL | 0 refills | Status: AC
  Filled 2023-06-04: qty 16, 4d supply, fill #0

## 2023-06-04 MED ORDER — BUDESONIDE-FORMOTEROL FUMARATE 80-4.5 MCG/ACT IN AERO
2.0000 | INHALATION_SPRAY | Freq: Two times a day (BID) | RESPIRATORY_TRACT | 12 refills | Status: DC
  Filled 2023-06-04: qty 10.2, 30d supply, fill #0

## 2023-06-04 MED ORDER — MOMETASONE FURO-FORMOTEROL FUM 200-5 MCG/ACT IN AERO
2.0000 | INHALATION_SPRAY | Freq: Two times a day (BID) | RESPIRATORY_TRACT | 0 refills | Status: DC
  Filled 2023-06-04: qty 13, 30d supply, fill #0

## 2023-06-04 MED ORDER — ALBUTEROL SULFATE HFA 108 (90 BASE) MCG/ACT IN AERS
2.0000 | INHALATION_SPRAY | RESPIRATORY_TRACT | Status: DC | PRN
  Administered 2023-06-04: 2 via RESPIRATORY_TRACT
  Filled 2023-06-04: qty 6.7

## 2023-06-04 MED ORDER — AEROCHAMBER PLUS FLO-VU SMALL MISC: 1.0000 | Freq: Once | Status: DC

## 2023-06-04 NOTE — Discharge Planning (Signed)
[facility] Medication Assistance Card *Pharmacies please call [phone] for claim processing assistance Name: Ronald Roberson                                                                                                                                                                                         Relationship Code:  1 ID (MRN): [id number]                                                                                                                                                                                  Person Code:  01 Bin: [id number] RX Group: [id number] Discharge Date: 12/2/204                                  RX PCN:  [id number] Expiration Date:06/11/2023                                           (must be filled within 7 days of discharge)     You have been approved to have the prescriptions written by your discharging physician filled through our [facility] (Medication Assistance Through [facility]) program. This program allows for a one-time (no refills) 34-day supply of selected medications for a low copay amount.  The copay is $0 per prescription.   Only certain pharmacies are participating in this program with [facility]. You will need to select one of the pharmacies from the attached list and take your prescriptions, this letter, and your photo ID to one of  the [facility] pharmacies.  We are excited that you are able to use the [facility] program to get your medications. These prescriptions  must be filled within 7 days of hospital discharge or they will no longer be valid for the [facility] program. Should you have any problems with your prescriptions please contact your case management team member at [phone] for [place]/ [facility].  Thank you,   [facility] Health Care Management

## 2023-06-04 NOTE — ED Notes (Signed)
Pt ambulated to bathroom. States he is feeling some better and no respiratory distress at this time.

## 2023-06-04 NOTE — ED Notes (Signed)
Patient denies pain and is resting comfortably. No respiratory distress at this time.

## 2023-06-04 NOTE — ED Triage Notes (Signed)
Pt caox4, ambulatory c/o asthma attack with increased SOB since yesterday. PMH asthma, states he recently ran out of inhaler and SOB feels consistent with previous asthma attacks.

## 2023-06-04 NOTE — ED Notes (Signed)
RT Note: Patient has HX Asthma and is used to using the Albuterol inhaler but was educated on using the spacer with the inhaler. Patient tolerated well

## 2023-06-04 NOTE — ED Provider Notes (Incomplete)
  [place] EMERGENCY DEPARTMENT AT [facility] Provider Note   CSN: [id number] Arrival date & time: 06/04/23  1006     History {Add pertinent medical, surgical, social history, OB history to HPI:1} Chief Complaint  Patient presents with  . Shortness of Breath    Ronald Roberson is a 42 y.o. male.  HPI     42yo male with history of asthma, past substance abuse, presents with concern for shortness of breath consistent with asthma.  He ran out of nebulizer treatments and reports his dypsnea has increased since then since yesterday. Feels like prior asthma. Denies chest pain, fever, leg pain or new swelling. Does feel better with breathing treatments. Has had some mild congestion and cough. Did stop smoking   No past medical history on file.   Home Medications Prior to Admission medications   Medication Sig Start Date End Date Taking? Authorizing Provider  albuterol (PROVENTIL) (2.5 MG/3ML) 0.083% nebulizer solution Take 3 mLs (2.5 mg total) by nebulization every 6 (six) hours as needed for wheezing or shortness of breath. 02/20/23   Elayne Snare K, DO  albuterol (PROVENTIL) (2.5 MG/3ML) 0.083% nebulizer solution Take 3 mLs (2.5 mg total) by nebulization every 6 (six) hours as needed for wheezing or shortness of breath. 03/17/23   Charlynne Pander, MD  albuterol (VENTOLIN HFA) 108 (90 Base) MCG/ACT inhaler Inhale 2 puffs into the lungs every 4 (four) hours as needed for wheezing or shortness of breath. 02/20/23   Elayne Snare K, DO  cetirizine (ZYRTEC) 10 MG tablet Take 10 mg by mouth daily as needed for allergies.    [provider]      Allergies    Patient has no known allergies.    Review of Systems   Review of Systems  Physical Exam Updated Vital Signs BP 131/81   Pulse 91   Temp 97.9 F (36.6 C)   Resp (!) 22   SpO2 98%  Physical Exam  ED Results / Procedures / Treatments   Labs (all labs ordered are listed, but only abnormal  results are displayed) Labs Reviewed - No data to display  EKG None  Radiology No results found.  Procedures Procedures  {Document cardiac monitor, telemetry assessment procedure when appropriate:1}  Medications Ordered in ED Medications  albuterol (VENTOLIN HFA) 108 (90 Base) MCG/ACT inhaler 2 puff (2 puffs Inhalation Given 06/04/23 1017)    ED Course/ Medical Decision Making/ A&P   {   Click here for ABCD2, HEART and other calculatorsREFRESH Note before signing :1}                                42yo male with history of asthma, past substance abuse, presents with concern for shortness of breath consistent with asthma.   {Document critical care time when appropriate:1} {Document review of labs and clinical decision tools ie heart score, Chads2Vasc2 etc:1}  {Document your independent review of radiology images, and any outside records:1} {Document your discussion with family members, caretakers, and with consultants:1} {Document social determinants of health affecting pt's care:1} {Document your decision making why or why not admission, treatments were needed:1} Final Clinical Impression(s) / ED Diagnoses Final diagnoses:  None    Rx / DC Orders ED Discharge Orders     None

## 2023-06-04 NOTE — ED Notes (Signed)
Pt given discharge instructions and reviewed prescriptions. Opportunities given for questions. Pt verbalizes understanding. Madi Bonfiglio R, RN 

## 2023-06-04 NOTE — ED Provider Notes (Signed)
  [place] EMERGENCY DEPARTMENT AT [facility] Provider Note   CSN: [id number] Arrival date & time: 06/04/23  1006     History {Add pertinent medical, surgical, social history, OB history to HPI:1} Chief Complaint  Patient presents with   Shortness of Breath    Ronald Roberson is a 42 y.o. male.  HPI        No past medical history on file.   Home Medications Prior to Admission medications   Medication Sig Start Date End Date Taking? Authorizing Provider  albuterol (PROVENTIL) (2.5 MG/3ML) 0.083% nebulizer solution Take 3 mLs (2.5 mg total) by nebulization every 6 (six) hours as needed for wheezing or shortness of breath. 02/20/23   Elayne Snare K, DO  albuterol (PROVENTIL) (2.5 MG/3ML) 0.083% nebulizer solution Take 3 mLs (2.5 mg total) by nebulization every 6 (six) hours as needed for wheezing or shortness of breath. 03/17/23   Charlynne Pander, MD  albuterol (VENTOLIN HFA) 108 (90 Base) MCG/ACT inhaler Inhale 2 puffs into the lungs every 4 (four) hours as needed for wheezing or shortness of breath. 02/20/23   Elayne Snare K, DO  cetirizine (ZYRTEC) 10 MG tablet Take 10 mg by mouth daily as needed for allergies.    [provider]      Allergies    Patient has no known allergies.    Review of Systems   Review of Systems  Physical Exam Updated Vital Signs BP 131/81   Pulse 91   Temp 97.9 F (36.6 C)   Resp (!) 22   SpO2 98%  Physical Exam  ED Results / Procedures / Treatments   Labs (all labs ordered are listed, but only abnormal results are displayed) Labs Reviewed - No data to display  EKG None  Radiology No results found.  Procedures Procedures  {Document cardiac monitor, telemetry assessment procedure when appropriate:1}  Medications Ordered in ED Medications  albuterol (VENTOLIN HFA) 108 (90 Base) MCG/ACT inhaler 2 puff (2 puffs Inhalation Given 06/04/23 1017)    ED Course/ Medical Decision Making/ A&P   {    Click here for ABCD2, HEART and other calculatorsREFRESH Note before signing :1}                              Medical Decision Making Risk Prescription drug management.   ***  {Document critical care time when appropriate:1} {Document review of labs and clinical decision tools ie heart score, Chads2Vasc2 etc:1}  {Document your independent review of radiology images, and any outside records:1} {Document your discussion with family members, caretakers, and with consultants:1} {Document social determinants of health affecting pt's care:1} {Document your decision making why or why not admission, treatments were needed:1} Final Clinical Impression(s) / ED Diagnoses Final diagnoses:  None    Rx / DC Orders ED Discharge Orders     None

## 2023-06-09 ENCOUNTER — Emergency Department (HOSPITAL_BASED_OUTPATIENT_CLINIC_OR_DEPARTMENT_OTHER): Payer: MEDICAID | Admitting: Radiology

## 2023-06-09 ENCOUNTER — Encounter (HOSPITAL_BASED_OUTPATIENT_CLINIC_OR_DEPARTMENT_OTHER): Payer: Self-pay | Admitting: Emergency Medicine

## 2023-06-09 ENCOUNTER — Other Ambulatory Visit: Payer: Self-pay

## 2023-06-09 ENCOUNTER — Emergency Department (HOSPITAL_BASED_OUTPATIENT_CLINIC_OR_DEPARTMENT_OTHER)
Admission: EM | Admit: 2023-06-09 | Discharge: 2023-06-09 | Disposition: A | Discharge status: Home / Self Care | Payer: MEDICAID | Attending: Emergency Medicine | Admitting: Emergency Medicine

## 2023-06-09 DIAGNOSIS — S46111A Strain of muscle, fascia and tendon of long head of biceps, right arm, initial encounter: Secondary | ICD-10-CM | POA: Insufficient documentation

## 2023-06-09 DIAGNOSIS — X501XXA Overexertion from prolonged static or awkward postures, initial encounter: Secondary | ICD-10-CM | POA: Insufficient documentation

## 2023-06-09 DIAGNOSIS — S46219A Strain of muscle, fascia and tendon of other parts of biceps, unspecified arm, initial encounter: Secondary | ICD-10-CM

## 2023-06-09 DIAGNOSIS — S59911A Unspecified injury of right forearm, initial encounter: Secondary | ICD-10-CM | POA: Diagnosis present

## 2023-06-09 MED ORDER — METHOCARBAMOL 500 MG PO TABS: 500.0000 mg | ORAL_TABLET | Freq: Two times a day (BID) | ORAL | 0 refills | Status: DC

## 2023-06-09 MED ORDER — KETOROLAC TROMETHAMINE 15 MG/ML IJ SOLN
15.0000 mg | Freq: Once | INTRAMUSCULAR | Status: AC
  Administered 2023-06-09: 15 mg via INTRAMUSCULAR
  Filled 2023-06-09: qty 1

## 2023-06-09 MED ORDER — LIDOCAINE 5 % EX PTCH: 1.0000 | MEDICATED_PATCH | CUTANEOUS | 0 refills | Status: DC

## 2023-06-09 NOTE — ED Notes (Signed)
Patient given discharge instructions. Questions were answered. Patient verbalized understanding of discharge instructions and care at home.  

## 2023-06-09 NOTE — ED Provider Notes (Signed)
[place] EMERGENCY DEPARTMENT AT [facility] Provider Note   CSN: [id number] Arrival date & time: 06/09/23  1053     History  Chief Complaint  Patient presents with   Arm Pain    Ronald Roberson is a 42 y.o. male no significant past medical history presents today for right arm pain after picking up a toilet yesterday.  Patient endorses bicep pain with movement.  Patient denies numbness, tingling, or weakness.   Arm Pain       Home Medications Prior to Admission medications   Medication Sig Start Date End Date Taking? Authorizing Provider  lidocaine (LIDODERM) 5 % Place 1 patch onto the skin daily. Remove & Discard patch within 12 hours or as directed by MD 06/09/23  Yes Dolphus Jenny, PA-C  albuterol (PROVENTIL) (2.5 MG/3ML) 0.083% nebulizer solution Take 3 mLs (2.5 mg total) by nebulization every 6 (six) hours as needed for wheezing or shortness of breath. 02/20/23   Elayne Snare K, DO  albuterol (PROVENTIL) (2.5 MG/3ML) 0.083% nebulizer solution Take 3 mLs (2.5 mg total) by nebulization every 6 (six) hours as needed for wheezing or shortness of breath. 03/17/23   Charlynne Pander, MD  albuterol (VENTOLIN HFA) 108 (90 Base) MCG/ACT inhaler Inhale 2 puffs into the lungs every 4 (four) hours as needed for wheezing or shortness of breath. 02/20/23   Elayne Snare K, DO  budesonide-formoterol (SYMBICORT) 80-4.5 MCG/ACT inhaler Inhale 2 puffs into the lungs 2 (two) times daily. 06/04/23   Alvira Monday, MD  cetirizine (ZYRTEC) 10 MG tablet Take 10 mg by mouth daily as needed for allergies.    [provider]  ipratropium-albuterol (DUONEB) 0.5-2.5 (3) MG/3ML SOLN Take 3 mLs by nebulization every 4 (four) hours as needed. 06/04/23   Alvira Monday, MD      Allergies    Patient has no known allergies.    Review of Systems   Review of Systems  Musculoskeletal:  Positive for arthralgias.    Physical Exam Updated Vital Signs BP 117/89   Pulse 80    Temp 98.6 F (37 C)   Resp 16   Ht 5\' 11"  (1.803 m)   Wt 123.4 kg   SpO2 97%   BMI 37.94 kg/m  Physical Exam Vitals and nursing note reviewed.  Constitutional:      General: He is not in acute distress.    Appearance: He is well-developed.  HENT:     Head: Normocephalic and atraumatic.  Eyes:     Conjunctiva/sclera: Conjunctivae normal.  Cardiovascular:     Rate and Rhythm: Normal rate and regular rhythm.     Pulses: Normal pulses.     Heart sounds: No murmur heard. Pulmonary:     Effort: Pulmonary effort is normal. No respiratory distress.     Breath sounds: Normal breath sounds.  Abdominal:     Palpations: Abdomen is soft.     Tenderness: There is no abdominal tenderness.  Musculoskeletal:        General: Signs of injury present. No swelling or deformity.     Cervical back: Neck supple.     Comments: Patient has pain with resisted flexion of the right arm at the elbow.  Patient is neurovascularly intact.  Patients bicep is tender to palpation  Skin:    General: Skin is warm and dry.     Capillary Refill: Capillary refill takes less than 2 seconds.  Neurological:     Mental Status: He is alert.  Psychiatric:  Mood and Affect: Mood normal.     ED Results / Procedures / Treatments   Labs (all labs ordered are listed, but only abnormal results are displayed) Labs Reviewed - No data to display  EKG None  Radiology DG Humerus Right  Result Date: 06/09/2023 CLINICAL DATA:  Arm pain. EXAM: RIGHT HUMERUS - 2+ VIEW COMPARISON:  None Available. FINDINGS: The right humerus appears intact without signs of fracture or dislocation. Chronic foreshortening of the distal right clavicle may be postsurgical or posttraumatic in etiology. IMPRESSION: 1. No acute abnormality. 2. Chronic of the distal right clavicle may be postsurgical or posttraumatic in etiology. Electronically Signed   By: Signa Kell M.D.   On: 06/09/2023 14:54    Procedures Procedures     Medications Ordered in ED Medications  ketorolac (TORADOL) 15 MG/ML injection 15 mg (15 mg Intramuscular Given 06/09/23 1346)    ED Course/ Medical Decision Making/ A&P                                 Medical Decision Making Amount and/or Complexity of Data Reviewed Radiology: ordered.  Risk Prescription drug management.   This patient presents to the ED with chief complaint(s) of right bicep pain with pertinent past medical history of none which further complicates the presenting complaint. The complaint involves an extensive differential diagnosis and also carries with it a high risk of complications and morbidity.    The differential diagnosis includes bicep strain, humerus fracture   ED Course and Reassessment:  Independent visualization of imaging: - I independently visualized the following imaging with scope of interpretation limited to determining acute life threatening conditions related to emergency care: Right humerus and shoulder x-rays, which revealed no acute abnormality, chronic of the distal right clavicle may be postsurgical or posttraumatic in etiology  Consultation: - Consulted or discussed management/test interpretation w/ external professional: None  Consideration for admission or further workup: Considered for admission or further workup however patient's vital signs, physical exam, and imaging have all been reassuring.  Patient symptoms likely due to bicep strain.  Patient will be given outpatient treatment of lidocaine patches and may take over-the-counter NSAIDs for pain.  Patient should follow-up with Ortho if symptoms persist for further evaluation and treatment.        Final Clinical Impression(s) / ED Diagnoses Final diagnoses:  Biceps strain, initial encounter    Rx / DC Orders ED Discharge Orders          Ordered    lidocaine (LIDODERM) 5 %  Every 24 hours        06/09/23 1514              Dolphus Jenny, PA-C 06/09/23  1514    Alvira Monday, MD 06/11/23 1127

## 2023-06-09 NOTE — Discharge Instructions (Addendum)
Today you were seen for right bicep strain.  Just please pick up your medication and take as needed.  He also alternate taking Tylenol and Motrin for pain as needed.  Please do not take Motrin for greater than 5 days in a row as this may cause rebound headaches. Thank you for letting us treat you today. After performing a physical exam and based reviewing your imaging, I feel you are safe to go home. Please follow up with your PCP in the next several days and provide them with your records from this visit. Return to the Emergency Room if pain becomes severe or symptoms worsen.

## 2023-06-09 NOTE — ED Triage Notes (Signed)
Pt via pov from home with left arm pain after picking something up yesterday. Pt reports that he is unable to use the arm much without severe pain. Pt alert & oriented, nad noted.

## 2023-06-18 ENCOUNTER — Encounter (HOSPITAL_BASED_OUTPATIENT_CLINIC_OR_DEPARTMENT_OTHER): Payer: Self-pay | Admitting: Emergency Medicine

## 2023-06-18 ENCOUNTER — Other Ambulatory Visit: Payer: Self-pay

## 2023-06-18 ENCOUNTER — Other Ambulatory Visit (HOSPITAL_BASED_OUTPATIENT_CLINIC_OR_DEPARTMENT_OTHER): Payer: Self-pay

## 2023-06-18 ENCOUNTER — Emergency Department (HOSPITAL_BASED_OUTPATIENT_CLINIC_OR_DEPARTMENT_OTHER)
Admission: EM | Admit: 2023-06-18 | Discharge: 2023-06-18 | Disposition: A | Discharge status: Home / Self Care | Payer: MEDICAID | Attending: Emergency Medicine | Admitting: Emergency Medicine

## 2023-06-18 DIAGNOSIS — L309 Dermatitis, unspecified: Secondary | ICD-10-CM | POA: Insufficient documentation

## 2023-06-18 DIAGNOSIS — R21 Rash and other nonspecific skin eruption: Secondary | ICD-10-CM | POA: Diagnosis present

## 2023-06-18 MED ORDER — IBUPROFEN 600 MG PO TABS
600.0000 mg | ORAL_TABLET | Freq: Four times a day (QID) | ORAL | 0 refills | Status: AC | PRN
  Filled 2023-06-18: qty 30, 8d supply, fill #0

## 2023-06-18 MED ORDER — HYDROCORTISONE 1 % EX CREA
TOPICAL_CREAM | CUTANEOUS | 0 refills | Status: AC
  Filled 2023-06-18: qty 28.35, 30d supply, fill #0

## 2023-06-18 NOTE — ED Provider Notes (Signed)
[place] EMERGENCY DEPARTMENT AT [facility] Provider Note   CSN: [id number] Arrival date & time: 06/18/23  0740     History  Chief Complaint  Patient presents with   Abscess    Ronald Roberson is a 42 y.o. male.  HPI     42 year old male comes in with chief complaint of abscess. Patient has previous history of perianal abscess.  He states that he started noticing a rash over his buttock region 2 days ago.  The lesion has associated burning type pain, no itching.  Symptoms not improving or getting worse.  Patient denies any history of IV drug use.  He denies any hide with sexual behavior besides 1 unprotected intercourse about a month ago.  He also denies any history of heavy alcohol use, new exposures, bedbugs or previous history of eczema.  Upon review of records, patient does have previous.  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  Home Medications Prior to Admission medications   Medication Sig Start Date End Date Taking? Authorizing Provider  hydrocortisone cream 1 % Apply to affected area 2 times daily 06/18/23  Yes Cassanda Walmer, MD  ibuprofen (ADVIL) 600 MG tablet Take 1 tablet (600 mg total) by mouth every 6 (six) hours as needed. 06/18/23  Yes Derwood Kaplan, MD  albuterol (PROVENTIL) (2.5 MG/3ML) 0.083% nebulizer solution Take 3 mLs (2.5 mg total) by nebulization every 6 (six) hours as needed for wheezing or shortness of breath. 02/20/23   Elayne Snare K, DO  albuterol (PROVENTIL) (2.5 MG/3ML) 0.083% nebulizer solution Take 3 mLs (2.5 mg total) by nebulization every 6 (six) hours as needed for wheezing or shortness of breath. 03/17/23   Charlynne Pander, MD  albuterol (VENTOLIN HFA) 108 (90 Base) MCG/ACT inhaler Inhale 2 puffs into the lungs every 4 (four) hours as needed for wheezing or shortness of breath. 02/20/23   Elayne Snare K, DO  budesonide-formoterol (SYMBICORT) 80-4.5 MCG/ACT inhaler Inhale 2 puffs into the  lungs 2 (two) times daily. 06/04/23   Alvira Monday, MD  cetirizine (ZYRTEC) 10 MG tablet Take 10 mg by mouth daily as needed for allergies.    [provider]  ipratropium-albuterol (DUONEB) 0.5-2.5 (3) MG/3ML SOLN Take 3 mLs by nebulization every 4 (four) hours as needed. 06/04/23   Alvira Monday, MD      Allergies    Patient has no known allergies.    Review of Systems   Review of Systems  All other systems reviewed and are negative.   Physical Exam Updated Vital Signs BP (!) 124/93 (BP Location: Right Arm)   Pulse 85   Temp 98.3 F (36.8 C) (Oral)   Resp (!) 22   Ht 5\' 11"  (1.803 m)   Wt 123.4 kg   SpO2 95%   BMI 37.94 kg/m  Physical Exam Vitals and nursing note reviewed.  Constitutional:      Appearance: He is well-developed.  HENT:     Head: Atraumatic.  Cardiovascular:     Rate and Rhythm: Normal rate.  Pulmonary:     Effort: Pulmonary effort is normal.  Musculoskeletal:     Cervical back: Neck supple.  Skin:    General: Skin is warm.     Comments: Counseled on papular lesions, with 1 or 2 being fluid-filled  Neurological:     Mental Status: He is alert and oriented to person, place, and time.        ED Results / Procedures /  Treatments   Labs (all labs ordered are listed, but only abnormal results are displayed) Labs Reviewed  HSV CULTURE AND TYPING    EKG None  Radiology No results found.  Procedures Procedures    Medications Ordered in ED Medications - No data to display  ED Course/ Medical Decision Making/ A&P                                 Medical Decision Making Amount and/or Complexity of Data Reviewed Labs: ordered.  Risk Prescription drug management.   42 year old male comes in with chief complaint of rash of the buttock region.  He is concerned that he has an abscess.  The area has some burning sensation to it.  Differential diagnosis considered for this patient includes cellulitis, dermatitis, atypical  presentation of zoster/shingles, staph infection.  Have reviewed patient's records including previous evaluation.  He does have UDS positive for amphetamines, but he denies any IV or IM use of drugs.  At this time, it does not appear that clinically patient has cellulitis.  We will scrape the wound and check for zoster.  We will put patient on hydrocortisone cream.  I have advised patient to return to the ER if his symptoms get worse.  If there is concerns for worsening rash that is inconsistent with zoster, then we probably should treat this like a staph infection.  Final Clinical Impression(s) / ED Diagnoses Final diagnoses:  Dermatitis    Rx / DC Orders ED Discharge Orders          Ordered    hydrocortisone cream 1 %        06/18/23 1055    ibuprofen (ADVIL) 600 MG tablet  Every 6 hours PRN        06/18/23 1055              Derwood Kaplan, MD 06/18/23 1106

## 2023-06-18 NOTE — Discharge Instructions (Signed)
It is not entirely clear why you are having the rash.  We have tested you for shingles, please follow-up on MyChart to see the results.  For now, start applying the topical ointment 2 or 3 times a day. You may also Eucerin or Vaseline ointment on it in between the applications.  Return to the ER if your rash gets worse.

## 2023-06-18 NOTE — ED Triage Notes (Signed)
Pt arrived POV, caox4, ambulatory c/o possible abscess on R buttocks that he noticed yesterday. Pt reports a few weeks ago he was tx for one in a different spot on the buttocks and completed abx. PMH asthma, pt denies being more SOB than normal but wheezing in triage, states he is almost out of inhaler and requesting for new one as well.

## 2023-06-20 LAB — HSV CULTURE AND TYPING

## 2023-06-28 ENCOUNTER — Other Ambulatory Visit (HOSPITAL_BASED_OUTPATIENT_CLINIC_OR_DEPARTMENT_OTHER): Payer: Self-pay

## 2023-06-29 ENCOUNTER — Inpatient Hospital Stay: Payer: MEDICAID | Admitting: Internal Medicine

## 2023-07-18 ENCOUNTER — Other Ambulatory Visit (HOSPITAL_BASED_OUTPATIENT_CLINIC_OR_DEPARTMENT_OTHER): Payer: Self-pay

## 2023-07-18 ENCOUNTER — Emergency Department (HOSPITAL_BASED_OUTPATIENT_CLINIC_OR_DEPARTMENT_OTHER)
Admission: EM | Admit: 2023-07-18 | Discharge: 2023-07-18 | Disposition: A | Discharge status: Home / Self Care | Payer: MEDICAID | Attending: Emergency Medicine | Admitting: Emergency Medicine

## 2023-07-18 ENCOUNTER — Other Ambulatory Visit: Payer: Self-pay

## 2023-07-18 ENCOUNTER — Encounter (HOSPITAL_BASED_OUTPATIENT_CLINIC_OR_DEPARTMENT_OTHER): Payer: Self-pay | Admitting: *Deleted

## 2023-07-18 DIAGNOSIS — Z760 Encounter for issue of repeat prescription: Secondary | ICD-10-CM | POA: Diagnosis not present

## 2023-07-18 DIAGNOSIS — R062 Wheezing: Secondary | ICD-10-CM | POA: Diagnosis present

## 2023-07-18 DIAGNOSIS — J4521 Mild intermittent asthma with (acute) exacerbation: Secondary | ICD-10-CM

## 2023-07-18 MED ORDER — PREDNISONE 20 MG PO TABS
40.0000 mg | ORAL_TABLET | Freq: Every day | ORAL | 0 refills | Status: AC
Start: 2023-07-18 — End: 2023-07-23
  Filled 2023-07-18: qty 10, 5d supply, fill #0

## 2023-07-18 MED ORDER — ALBUTEROL SULFATE HFA 108 (90 BASE) MCG/ACT IN AERS
4.0000 | INHALATION_SPRAY | Freq: Once | RESPIRATORY_TRACT | Status: AC
  Administered 2023-07-18: 4 via RESPIRATORY_TRACT
  Filled 2023-07-18: qty 6.7

## 2023-07-18 MED ORDER — ALBUTEROL SULFATE HFA 108 (90 BASE) MCG/ACT IN AERS
2.0000 | INHALATION_SPRAY | RESPIRATORY_TRACT | 0 refills | Status: DC | PRN
  Filled 2023-07-18: qty 6.7, 25d supply, fill #0

## 2023-07-18 MED ORDER — BUDESONIDE-FORMOTEROL FUMARATE 80-4.5 MCG/ACT IN AERO
2.0000 | INHALATION_SPRAY | Freq: Two times a day (BID) | RESPIRATORY_TRACT | 12 refills | Status: DC
Start: 2023-07-18 — End: 2023-08-20
  Filled 2023-07-18: qty 10.2, 30d supply, fill #0

## 2023-07-18 NOTE — Discharge Instructions (Signed)
 It was a pleasure taking care of you today.  As discussed, you were offered a chest x-ray however, you declined.  I suspect your symptoms are likely related to an asthma exacerbation.  I refilled your albuterol  inhaler and Symbicort .  I am also sending you home with steroids.  Take for the next 5 days.  Please follow-up with PCP in 2 to 3 days for a recheck or return for worsening symptoms.

## 2023-07-18 NOTE — ED Triage Notes (Signed)
 Pt reports that he needs an albuterol  inhaler.  Pt states that he has been out for 2 days and that whenever he is out his asthma flares up.

## 2023-07-18 NOTE — ED Provider Notes (Signed)
 [place] EMERGENCY DEPARTMENT AT [facility] [place] Provider Note   CSN: [id number] Arrival date & time: 07/18/23  1053     History  Chief Complaint  Patient presents with   Medication Refill    Ronald Roberson is a 43 y.o. male with a past medical history significant for substance-induced mood disorder who presents to the ED requesting a medication refill.  Patient is requesting a refill on his albuterol  inhaler and Symbicort .  Notes he ran out roughly 2 days ago.  Admits to a slight wheeze and shortness of breath.  No chest pain.  Denies fever and chills.  No other complaints.  History obtained from patient and past medical records. No interpreter used during encounter.       Home Medications Prior to Admission medications   Medication Sig Start Date End Date Taking? Authorizing Provider  predniSONE  (DELTASONE ) 20 MG tablet Take 2 tablets (40 mg total) by mouth daily for 5 days. 07/18/23 07/23/23 Yes Amoreena Neubert, Girtha Lama, PA-C  albuterol  (PROVENTIL ) (2.5 MG/3ML) 0.083% nebulizer solution Take 3 mLs (2.5 mg total) by nebulization every 6 (six) hours as needed for wheezing or shortness of breath. 02/20/23   Celesta Coke K, DO  albuterol  (PROVENTIL ) (2.5 MG/3ML) 0.083% nebulizer solution Take 3 mLs (2.5 mg total) by nebulization every 6 (six) hours as needed for wheezing or shortness of breath. 03/17/23   Dalene Duck, MD  albuterol  (VENTOLIN  HFA) 108 (90 Base) MCG/ACT inhaler Inhale 2 puffs into the lungs every 4 (four) hours as needed for wheezing or shortness of breath. 07/18/23   Makylah Bossard C, PA-C  budesonide -formoterol  (SYMBICORT ) 80-4.5 MCG/ACT inhaler Inhale 2 puffs into the lungs 2 (two) times daily. 07/18/23   Jovonda Selner C, PA-C  cetirizine (ZYRTEC) 10 MG tablet Take 10 mg by mouth daily as needed for allergies.    [provider]  hydrocortisone  cream 1 % Apply to affected area 2 times daily 06/18/23   Deatra Face, MD  ibuprofen  (ADVIL ) 600  MG tablet Take 1 tablet (600 mg total) by mouth every 6 (six) hours as needed. 06/18/23   Deatra Face, MD  ipratropium-albuterol  (DUONEB) 0.5-2.5 (3) MG/3ML SOLN Take 3 mLs by nebulization every 4 (four) hours as needed. 06/04/23   Scarlette Currier, MD      Allergies    Patient has no known allergies.    Review of Systems   Review of Systems  Constitutional:  Negative for fever.  Respiratory:  Positive for shortness of breath.   Cardiovascular:  Negative for chest pain.    Physical Exam Updated Vital Signs BP (!) 147/90 (BP Location: Right Arm)   Pulse 91   Temp (!) 97.3 F (36.3 C)   Resp (!) 22   SpO2 98%  Physical Exam Vitals and nursing note reviewed.  Constitutional:      General: He is not in acute distress.    Appearance: He is not ill-appearing.  HENT:     Head: Normocephalic.  Eyes:     Pupils: Pupils are equal, round, and reactive to light.  Cardiovascular:     Rate and Rhythm: Normal rate and regular rhythm.     Pulses: Normal pulses.     Heart sounds: Normal heart sounds. No murmur heard.    No friction rub. No gallop.  Pulmonary:     Effort: Pulmonary effort is normal.     Breath sounds: Wheezing present.     Comments: Slight expiratory wheeze Abdominal:     General:  Abdomen is flat. There is no distension.     Palpations: Abdomen is soft.     Tenderness: There is no abdominal tenderness. There is no guarding or rebound.  Musculoskeletal:        General: Normal range of motion.     Cervical back: Neck supple.  Skin:    General: Skin is warm and dry.  Neurological:     General: No focal deficit present.     Mental Status: He is alert.  Psychiatric:        Mood and Affect: Mood normal.        Behavior: Behavior normal.     ED Results / Procedures / Treatments   Labs (all labs ordered are listed, but only abnormal results are displayed) Labs Reviewed - No data to display  EKG None  Radiology No results found.  Procedures Procedures     Medications Ordered in ED Medications  albuterol  (VENTOLIN  HFA) 108 (90 Base) MCG/ACT inhaler 4 puff (has no administration in time range)    ED Course/ Medical Decision Making/ A&P                                 Medical Decision Making Risk Prescription drug management.   43 year old male with history of asthma presents to the ED requesting a refill on his albuterol  inhaler and Symbicort .  He does admit to some shortness of breath and wheezing.  Notes he ran out of his medications 2 days ago.  No fever or chills.  No chest pain.  Upon arrival, stable vitals.  Patient slightly tachypneic.  Normal O2 saturation.  Patient in no acute distress.  Physical exam significant for slight expiratory wheeze heard throughout.  Offered further workup of his current asthma exacerbation however, patient notes he prefers just to get a refill on his albuterol  and Symbicort .  Will also discharge patient with prednisone  for asthma exacerbation.  Given lung exam, I have a low suspicion for pneumonia.  Suspect symptoms likely related to asthma exacerbation.  Patient declined chest x-ray.  No evidence of respiratory distress.  Patient stable for discharge. Strict ED precautions discussed with patient. Patient states understanding and agrees to plan. Patient discharged home in no acute distress and stable vitals  No PCP on file Hx asthma Lives at home       Final Clinical Impression(s) / ED Diagnoses Final diagnoses:  Medication refill  Mild intermittent asthma with exacerbation    Rx / DC Orders ED Discharge Orders          Ordered    budesonide -formoterol  (SYMBICORT ) 80-4.5 MCG/ACT inhaler  2 times daily        07/18/23 1232    albuterol  (VENTOLIN  HFA) 108 (90 Base) MCG/ACT inhaler  Every 4 hours PRN        07/18/23 1232    predniSONE  (DELTASONE ) 20 MG tablet  Daily        07/18/23 1232              Lear Prosper, PA-C 07/18/23 1236    Tegeler, Marine Sia, MD 07/18/23  1540

## 2023-07-18 NOTE — ED Notes (Signed)

## 2023-08-20 ENCOUNTER — Emergency Department (HOSPITAL_BASED_OUTPATIENT_CLINIC_OR_DEPARTMENT_OTHER)
Admission: EM | Admit: 2023-08-20 | Discharge: 2023-08-20 | Disposition: A | Discharge status: Home / Self Care | Payer: MEDICAID | Attending: Emergency Medicine | Admitting: Emergency Medicine

## 2023-08-20 ENCOUNTER — Other Ambulatory Visit (HOSPITAL_BASED_OUTPATIENT_CLINIC_OR_DEPARTMENT_OTHER): Payer: Self-pay

## 2023-08-20 ENCOUNTER — Other Ambulatory Visit: Payer: Self-pay

## 2023-08-20 ENCOUNTER — Encounter (HOSPITAL_BASED_OUTPATIENT_CLINIC_OR_DEPARTMENT_OTHER): Payer: Self-pay | Admitting: Emergency Medicine

## 2023-08-20 DIAGNOSIS — Z760 Encounter for issue of repeat prescription: Secondary | ICD-10-CM | POA: Diagnosis not present

## 2023-08-20 DIAGNOSIS — J4541 Moderate persistent asthma with (acute) exacerbation: Secondary | ICD-10-CM | POA: Insufficient documentation

## 2023-08-20 DIAGNOSIS — R062 Wheezing: Secondary | ICD-10-CM | POA: Diagnosis present

## 2023-08-20 MED ORDER — ALBUTEROL SULFATE HFA 108 (90 BASE) MCG/ACT IN AERS: 2.0000 | INHALATION_SPRAY | RESPIRATORY_TRACT | Status: DC | PRN

## 2023-08-20 MED ORDER — ALBUTEROL SULFATE HFA 108 (90 BASE) MCG/ACT IN AERS
INHALATION_SPRAY | RESPIRATORY_TRACT | Status: AC
  Administered 2023-08-20: 2 via RESPIRATORY_TRACT
  Filled 2023-08-20: qty 6.7

## 2023-08-20 MED ORDER — PREDNISONE 20 MG PO TABS
40.0000 mg | ORAL_TABLET | Freq: Every day | ORAL | 0 refills | Status: DC
  Filled 2023-08-20: qty 10, 5d supply, fill #0

## 2023-08-20 MED ORDER — IPRATROPIUM-ALBUTEROL 0.5-2.5 (3) MG/3ML IN SOLN
RESPIRATORY_TRACT | Status: AC
Start: 2023-08-20 — End: 2023-08-20
  Administered 2023-08-20: 3 mL via RESPIRATORY_TRACT
  Filled 2023-08-20: qty 3

## 2023-08-20 MED ORDER — IPRATROPIUM-ALBUTEROL 0.5-2.5 (3) MG/3ML IN SOLN
3.0000 mL | Freq: Once | RESPIRATORY_TRACT | Status: AC
  Filled 2023-08-20: qty 3

## 2023-08-20 MED ORDER — BUDESONIDE-FORMOTEROL FUMARATE 80-4.5 MCG/ACT IN AERO: 2.0000 | INHALATION_SPRAY | Freq: Two times a day (BID) | RESPIRATORY_TRACT | 12 refills | Status: DC

## 2023-08-20 MED ORDER — BUDESONIDE-FORMOTEROL FUMARATE 80-4.5 MCG/ACT IN AERO
2.0000 | INHALATION_SPRAY | Freq: Two times a day (BID) | RESPIRATORY_TRACT | 12 refills | Status: DC
  Filled 2023-08-20: qty 10.2, 30d supply, fill #0
  Filled 2023-10-29 (×3): qty 10.2, 30d supply, fill #1

## 2023-08-20 MED ORDER — PREDNISONE 50 MG PO TABS
60.0000 mg | ORAL_TABLET | Freq: Once | ORAL | Status: AC
  Administered 2023-08-20: 60 mg via ORAL
  Filled 2023-08-20: qty 1

## 2023-08-20 NOTE — ED Provider Notes (Signed)
 [place] EMERGENCY DEPARTMENT AT [facility] Provider Note   CSN: [id number] Arrival date & time: 08/20/23  1524     History  Chief Complaint  Patient presents with   Medication Refill    Ronald Roberson is a 43 y.o. male.  Patient is a 43 year old male with a history of asthma who is currently on Symbicort, albuterol and nebulizers as needed who is presenting today after running out of his albuterol and Symbicort yesterday with worsening cough, shortness of breath and wheezing.  He denies any fever, myalgias or malaise.  He reports he always has a little bit of a productive cough with yellow sputum but it is unchanged.  The history is provided by the patient.  Medication Refill      Home Medications Prior to Admission medications   Medication Sig Start Date End Date Taking? Authorizing Provider  predniSONE (DELTASONE) 20 MG tablet Take 2 tablets (40 mg total) by mouth daily. 08/20/23  Yes Gwyneth Sprout, MD  albuterol (PROVENTIL) (2.5 MG/3ML) 0.083% nebulizer solution Take 3 mLs (2.5 mg total) by nebulization every 6 (six) hours as needed for wheezing or shortness of breath. 02/20/23   Elayne Snare K, DO  albuterol (PROVENTIL) (2.5 MG/3ML) 0.083% nebulizer solution Take 3 mLs (2.5 mg total) by nebulization every 6 (six) hours as needed for wheezing or shortness of breath. 03/17/23   Charlynne Pander, MD  albuterol (VENTOLIN HFA) 108 (90 Base) MCG/ACT inhaler Inhale 2 puffs into the lungs every 4 (four) hours as needed for wheezing or shortness of breath. 07/18/23   Mannie Stabile, PA-C  budesonide-formoterol (SYMBICORT) 80-4.5 MCG/ACT inhaler Inhale 2 puffs into the lungs 2 (two) times daily. 08/20/23   Gwyneth Sprout, MD  cetirizine (ZYRTEC) 10 MG tablet Take 10 mg by mouth daily as needed for allergies.    [provider]  hydrocortisone cream 1 % Apply to affected area 2 times daily 06/18/23   Derwood Kaplan, MD  ibuprofen (ADVIL) 600 MG tablet  Take 1 tablet (600 mg total) by mouth every 6 (six) hours as needed. 06/18/23   Derwood Kaplan, MD  ipratropium-albuterol (DUONEB) 0.5-2.5 (3) MG/3ML SOLN Take 3 mLs by nebulization every 4 (four) hours as needed. 06/04/23   Alvira Monday, MD      Allergies    Patient has no known allergies.    Review of Systems   Review of Systems  Physical Exam Updated Vital Signs BP 125/86   Pulse 90   Temp 98.8 F (37.1 C) (Oral)   Resp 18   SpO2 99%  Physical Exam Vitals and nursing note reviewed.  Constitutional:      General: He is not in acute distress.    Appearance: He is well-developed.  HENT:     Head: Normocephalic and atraumatic.  Eyes:     Conjunctiva/sclera: Conjunctivae normal.     Pupils: Pupils are equal, round, and reactive to light.  Cardiovascular:     Rate and Rhythm: Normal rate and regular rhythm.     Heart sounds: No murmur heard. Pulmonary:     Effort: Pulmonary effort is normal. No respiratory distress.     Breath sounds: Wheezing present. No rales.     Comments: Diffuse wheezing in all lung fields Musculoskeletal:        General: No tenderness. Normal range of motion.     Cervical back: Normal range of motion and neck supple.  Skin:    General: Skin is warm and dry.  Findings: No erythema or rash.  Neurological:     Mental Status: He is alert and oriented to person, place, and time. Mental status is at baseline.  Psychiatric:        Behavior: Behavior normal.     ED Results / Procedures / Treatments   Labs (all labs ordered are listed, but only abnormal results are displayed) Labs Reviewed - No data to display  EKG None  Radiology No results found.  Procedures Procedures    Medications Ordered in ED Medications  albuterol (VENTOLIN HFA) 108 (90 Base) MCG/ACT inhaler 2 puff (2 puffs Inhalation Not Given 08/20/23 1550)  ipratropium-albuterol (DUONEB) 0.5-2.5 (3) MG/3ML nebulizer solution 3 mL (3 mLs Nebulization Given 08/20/23 1555)   predniSONE (DELTASONE) tablet 60 mg (60 mg Oral Given 08/20/23 1614)    ED Course/ Medical Decision Making/ A&P                                 Medical Decision Making Risk Prescription drug management.   Pt with typical asthma exacerbation  symptoms.  No infectious sx, productive cough or other complaints.  Wheezing on exam.  will give steroids, albuterol/atrovent and recheck.  4:26 PM On recheck patient's wheezing has significantly improved.  Will DC home.         Final Clinical Impression(s) / ED Diagnoses Final diagnoses:  Moderate persistent asthma with exacerbation    Rx / DC Orders ED Discharge Orders          Ordered    budesonide-formoterol (SYMBICORT) 80-4.5 MCG/ACT inhaler  2 times daily,   Status:  Discontinued        08/20/23 1547    budesonide-formoterol (SYMBICORT) 80-4.5 MCG/ACT inhaler  2 times daily        08/20/23 1547    predniSONE (DELTASONE) 20 MG tablet  Daily        08/20/23 1626              Gwyneth Sprout, MD 08/20/23 1626

## 2023-08-20 NOTE — ED Notes (Signed)
 RTT at room for eval.

## 2023-08-20 NOTE — ED Triage Notes (Signed)
 Would like a prescription for albuterol breathing treatments. Has been out of inhaler as well. No PCP at this time.

## 2023-08-30 ENCOUNTER — Other Ambulatory Visit (HOSPITAL_BASED_OUTPATIENT_CLINIC_OR_DEPARTMENT_OTHER): Payer: Self-pay

## 2023-09-05 ENCOUNTER — Emergency Department (HOSPITAL_BASED_OUTPATIENT_CLINIC_OR_DEPARTMENT_OTHER)
Admission: EM | Admit: 2023-09-05 | Discharge: 2023-09-06 | Disposition: A | Discharge status: Home / Self Care | Payer: MEDICAID | Attending: Emergency Medicine | Admitting: Emergency Medicine

## 2023-09-05 ENCOUNTER — Emergency Department (HOSPITAL_BASED_OUTPATIENT_CLINIC_OR_DEPARTMENT_OTHER): Payer: MEDICAID | Admitting: Radiology

## 2023-09-05 ENCOUNTER — Other Ambulatory Visit: Payer: Self-pay

## 2023-09-05 DIAGNOSIS — J101 Influenza due to other identified influenza virus with other respiratory manifestations: Secondary | ICD-10-CM | POA: Insufficient documentation

## 2023-09-05 DIAGNOSIS — J45901 Unspecified asthma with (acute) exacerbation: Secondary | ICD-10-CM | POA: Insufficient documentation

## 2023-09-05 DIAGNOSIS — R062 Wheezing: Secondary | ICD-10-CM | POA: Diagnosis present

## 2023-09-05 LAB — RESP PANEL BY RT-PCR (RSV, FLU A&B, COVID)  RVPGX2
Influenza A by PCR: POSITIVE — AB
Influenza B by PCR: NEGATIVE
Resp Syncytial Virus by PCR: NEGATIVE
SARS Coronavirus 2 by RT PCR: NEGATIVE

## 2023-09-05 MED ORDER — BENZONATATE 100 MG PO CAPS
100.0000 mg | ORAL_CAPSULE | Freq: Once | ORAL | Status: AC
  Administered 2023-09-05: 100 mg via ORAL
  Filled 2023-09-05: qty 1

## 2023-09-05 MED ORDER — PREDNISONE 50 MG PO TABS
60.0000 mg | ORAL_TABLET | Freq: Once | ORAL | Status: AC
  Administered 2023-09-05: 60 mg via ORAL
  Filled 2023-09-05: qty 1

## 2023-09-05 MED ORDER — ACETAMINOPHEN 500 MG PO TABS
1000.0000 mg | ORAL_TABLET | Freq: Four times a day (QID) | ORAL | Status: DC | PRN
  Administered 2023-09-05: 1000 mg via ORAL
  Filled 2023-09-05: qty 2

## 2023-09-05 MED ORDER — IPRATROPIUM-ALBUTEROL 0.5-2.5 (3) MG/3ML IN SOLN
RESPIRATORY_TRACT | Status: AC
  Administered 2023-09-05: 3 mL via RESPIRATORY_TRACT
  Filled 2023-09-05: qty 3

## 2023-09-05 MED ORDER — MAGNESIUM SULFATE 2 GM/50ML IV SOLN
2.0000 g | Freq: Once | INTRAVENOUS | Status: AC
  Administered 2023-09-05: 2 g via INTRAVENOUS
  Filled 2023-09-05: qty 50

## 2023-09-05 MED ORDER — ALBUTEROL SULFATE (2.5 MG/3ML) 0.083% IN NEBU
INHALATION_SOLUTION | RESPIRATORY_TRACT | Status: AC
  Filled 2023-09-05: qty 6

## 2023-09-05 MED ORDER — ALBUTEROL SULFATE (2.5 MG/3ML) 0.083% IN NEBU
INHALATION_SOLUTION | RESPIRATORY_TRACT | Status: AC
  Administered 2023-09-05: 2.5 mg via RESPIRATORY_TRACT
  Filled 2023-09-05: qty 3

## 2023-09-05 MED ORDER — ALBUTEROL SULFATE (2.5 MG/3ML) 0.083% IN NEBU: 2.5000 mg | INHALATION_SOLUTION | Freq: Once | RESPIRATORY_TRACT | Status: AC

## 2023-09-05 MED ORDER — ALBUTEROL SULFATE (2.5 MG/3ML) 0.083% IN NEBU
5.0000 mg | INHALATION_SOLUTION | Freq: Once | RESPIRATORY_TRACT | Status: AC
  Administered 2023-09-05: 5 mg via RESPIRATORY_TRACT

## 2023-09-05 MED ORDER — IPRATROPIUM-ALBUTEROL 0.5-2.5 (3) MG/3ML IN SOLN: 3.0000 mL | Freq: Once | RESPIRATORY_TRACT | Status: AC

## 2023-09-05 MED ORDER — SODIUM CHLORIDE 0.9 % IV BOLUS
1000.0000 mL | Freq: Once | INTRAVENOUS | Status: AC
  Administered 2023-09-05: 1000 mL via INTRAVENOUS

## 2023-09-05 NOTE — ED Triage Notes (Signed)
 Pt POV reporting increased SOB, hx asthma, used breathing treatments at home without improvement. Labored in triage.

## 2023-09-05 NOTE — ED Provider Notes (Signed)
 [place] EMERGENCY DEPARTMENT AT [facility] Provider Note   CSN: [id number] Arrival date & time: 09/05/23  2106     History  Chief Complaint  Patient presents with   Shortness of Breath    Ronald Roberson is a 43 y.o. male with past medical history of asthma presenting to emergency room with 1 day of shortness of breath and wheezing.  Patient reports he has been using breathing treatments at home without improvement.  Reports that he has had sick contact at home.  Reports that productive cough for about 2 days associated with congestion.   Denies any chest pain, abdominal pain nausea vomiting diarrhea.   Shortness of Breath      Home Medications Prior to Admission medications   Medication Sig Start Date End Date Taking? Authorizing Provider  albuterol (PROVENTIL) (2.5 MG/3ML) 0.083% nebulizer solution Take 3 mLs (2.5 mg total) by nebulization every 6 (six) hours as needed for wheezing or shortness of breath. 02/20/23   Elayne Snare K, DO  albuterol (PROVENTIL) (2.5 MG/3ML) 0.083% nebulizer solution Take 3 mLs (2.5 mg total) by nebulization every 6 (six) hours as needed for wheezing or shortness of breath. 03/17/23   Charlynne Pander, MD  albuterol (VENTOLIN HFA) 108 (90 Base) MCG/ACT inhaler Inhale 2 puffs into the lungs every 4 (four) hours as needed for wheezing or shortness of breath. 07/18/23   Mannie Stabile, PA-C  budesonide-formoterol (SYMBICORT) 80-4.5 MCG/ACT inhaler Inhale 2 puffs into the lungs 2 (two) times daily. 08/20/23   Gwyneth Sprout, MD  cetirizine (ZYRTEC) 10 MG tablet Take 10 mg by mouth daily as needed for allergies.    [provider]  hydrocortisone cream 1 % Apply to affected area 2 times daily 06/18/23   Derwood Kaplan, MD  ibuprofen (ADVIL) 600 MG tablet Take 1 tablet (600 mg total) by mouth every 6 (six) hours as needed. 06/18/23   Derwood Kaplan, MD  ipratropium-albuterol (DUONEB) 0.5-2.5 (3) MG/3ML SOLN Take 3 mLs by  nebulization every 4 (four) hours as needed. 06/04/23   Alvira Monday, MD  predniSONE (DELTASONE) 20 MG tablet Take 2 tablets (40 mg total) by mouth daily. 08/20/23   Gwyneth Sprout, MD      Allergies    Patient has no known allergies.    Review of Systems   Review of Systems  Respiratory:  Positive for shortness of breath.     Physical Exam Updated Vital Signs BP 127/65   Pulse (!) 120   Temp 99.2 F (37.3 C) (Oral)   Resp 17   Ht 5\' 11"  (1.803 m)   Wt 126.1 kg   SpO2 95%   BMI 38.77 kg/m  Physical Exam Vitals and nursing note reviewed.  Constitutional:      General: He is not in acute distress.    Appearance: He is not toxic-appearing.  HENT:     Head: Normocephalic and atraumatic.  Eyes:     General: No scleral icterus.    Conjunctiva/sclera: Conjunctivae normal.  Cardiovascular:     Rate and Rhythm: Normal rate and regular rhythm.     Pulses: Normal pulses.     Heart sounds: Normal heart sounds.  Pulmonary:     Effort: Pulmonary effort is normal. No respiratory distress.     Breath sounds: Wheezing present.  Abdominal:     General: Abdomen is flat. Bowel sounds are normal.     Palpations: Abdomen is soft.     Tenderness: There is no abdominal tenderness.  Musculoskeletal:     Right lower leg: No edema.     Left lower leg: No edema.  Skin:    General: Skin is warm and dry.     Findings: No lesion.  Neurological:     General: No focal deficit present.     Mental Status: He is alert and oriented to person, place, and time. Mental status is at baseline.     ED Results / Procedures / Treatments   Labs (all labs ordered are listed, but only abnormal results are displayed) Labs Reviewed  RESP PANEL BY RT-PCR (RSV, FLU A&B, COVID)  RVPGX2 - Abnormal; Notable for the following components:      Result Value   Influenza A by PCR POSITIVE (*)    All other components within normal limits    EKG EKG Interpretation Date/Time:  Wednesday September 05 2023  21:15:40 EST Ventricular Rate:  116 PR Interval:  156 QRS Duration:  74 QT Interval:  292 QTC Calculation: 405 R Axis:   93  Text Interpretation: Sinus tachycardia Rightward axis Low voltage QRS Septal infarct , age undetermined Abnormal ECG Confirmed by Vonita Moss [phone]) on 09/05/2023 10:30:13 PM  Radiology DG Chest 2 View Result Date: 09/05/2023 CLINICAL DATA:  Shortness of breath.  History of asthma. EXAM: CHEST - 2 VIEW COMPARISON:  Radiograph 03/17/2023 FINDINGS: The cardiomediastinal contours are normal. Moderate peribronchial thickening pulmonary vasculature is normal. No consolidation, pleural effusion, or pneumothorax. No acute osseous abnormalities are seen. IMPRESSION: Moderate peribronchial thickening can be seen with asthma or bronchitis. Electronically Signed   By: Narda Rutherford M.D.   On: 09/05/2023 22:46    Procedures Procedures    Medications Ordered in ED Medications  albuterol (PROVENTIL) (2.5 MG/3ML) 0.083% nebulizer solution 5 mg (5 mg Nebulization Given 09/05/23 2120)  predniSONE (DELTASONE) tablet 60 mg (60 mg Oral Given 09/05/23 2207)  albuterol (PROVENTIL) (2.5 MG/3ML) 0.083% nebulizer solution 2.5 mg (2.5 mg Nebulization Given 09/05/23 2205)  ipratropium-albuterol (DUONEB) 0.5-2.5 (3) MG/3ML nebulizer solution 3 mL (3 mLs Nebulization Given 09/05/23 2206)  magnesium sulfate IVPB 2 g 50 mL (0 g Intravenous Stopped 09/05/23 2320)  benzonatate (TESSALON) capsule 100 mg (100 mg Oral Given 09/05/23 2250)  sodium chloride 0.9 % bolus 1,000 mL (0 mLs Intravenous Stopped 09/06/23 0020)  lidocaine (XYLOCAINE) 2 % viscous mouth solution 15 mL (15 mLs Mouth/Throat Given 09/06/23 0044)  albuterol (VENTOLIN HFA) 108 (90 Base) MCG/ACT inhaler 1 puff (1 puff Inhalation Given 09/06/23 0055)  Aerochamber Plus device 1 each (1 each Other Given 09/06/23 0057)    ED Course/ Medical Decision Making/ A&P Clinical Course as of 09/06/23 1700  Thu Sep 06, 2023  0053 Patient feeling better. No  longer short of breath. Mild wheezing on exam, but improved. Continues to have normal sats on room air. Still slightly tachy but improved after NS.  [JB]    Clinical Course User Index [JB] Demontrae Gilbert, Horald Chestnut, PA-C                                 Medical Decision Making Amount and/or Complexity of Data Reviewed Radiology: ordered.  Risk Prescription drug management.   Lenna Gilford 43 y.o. presented today for shortness of breath.  Working DDx that I considered at this time includes, but not limited to, asthma/COPD exacerbation, URI, viral illness, anemia, ACS, PE, pneumonia, pleural effusion, lung mass.  R/o DDx: These are considered less likely  due to history of present illness, physical exam, labs/imaging findings  Pmhx: asthma   Review of prior external notes: Recent ed visit for same 08/20/23   Unique Tests and My Interpretation:  Resp panel: Influenza A Chest x-ray with findings of peribronchial thickening consistent with asthma   Problem List / ED Course / Critical interventions / Medication management  Patient having wheezing and shortness of breath. This feels like his asthma. Reports he also feels sick and has sick contacts. He has symptoms of URI and testing flu positive. Feel this is triggering patient asthma symptoms. He is in no acute distress, no increased respiratory rate. Does have diffuse wheezing. He was 91% on RA upon arrival. He was observed for short time during stay after albuterol, steroids and Mag and saturation, symptoms and vitals improved. Chest x-ray showing peribronchial thickening. No pneumonia or pneumothorax. Symptoms improved, discharged in stable condition. Still slightly tachycardic, however likely secondary to multiple breathing treatments and steriods. Patient will go home with albuterol rescue inhaler, will pick up tamiflu and steroids. Discussed following with PCP as he has had several visits to ED for similar thing. Stable for discharge.  I ordered  medication including albuterol, steroid, mag Reevaluation of the patient after these medicines showed that the patient improved Patients vitals assessed. Upon arrival patient is hemodynamically stable.  I have reviewed the patients home medicines and have made adjustments as needed           Final Clinical Impression(s) / ED Diagnoses Final diagnoses:  Mild asthma with acute exacerbation, unspecified whether persistent  Influenza A    Rx / DC Orders ED Discharge Orders     None         Raford Pitcher Evalee Jefferson 09/06/23 1720    Rondel Baton, MD 09/07/23 2109

## 2023-09-06 ENCOUNTER — Other Ambulatory Visit (HOSPITAL_BASED_OUTPATIENT_CLINIC_OR_DEPARTMENT_OTHER): Payer: Self-pay

## 2023-09-06 MED ORDER — ALBUTEROL SULFATE HFA 108 (90 BASE) MCG/ACT IN AERS
1.0000 | INHALATION_SPRAY | Freq: Once | RESPIRATORY_TRACT | Status: AC
  Administered 2023-09-06: 1 via RESPIRATORY_TRACT
  Filled 2023-09-06: qty 6.7

## 2023-09-06 MED ORDER — PREDNISONE 20 MG PO TABS
40.0000 mg | ORAL_TABLET | Freq: Every day | ORAL | 0 refills | Status: DC
  Filled 2023-09-06: qty 10, 5d supply, fill #0

## 2023-09-06 MED ORDER — BENZONATATE 100 MG PO CAPS
100.0000 mg | ORAL_CAPSULE | Freq: Three times a day (TID) | ORAL | 0 refills | Status: AC
  Filled 2023-09-06: qty 21, 7d supply, fill #0

## 2023-09-06 MED ORDER — LIDOCAINE VISCOUS HCL 2 % MT SOLN
15.0000 mL | Freq: Once | OROMUCOSAL | Status: AC
  Administered 2023-09-06: 15 mL via OROMUCOSAL
  Filled 2023-09-06: qty 15

## 2023-09-06 MED ORDER — BENZONATATE 100 MG PO CAPS: 100.0000 mg | ORAL_CAPSULE | Freq: Three times a day (TID) | ORAL | 0 refills | Status: DC

## 2023-09-06 MED ORDER — PREDNISONE 20 MG PO TABS: 40.0000 mg | ORAL_TABLET | Freq: Every day | ORAL | 0 refills | Status: DC

## 2023-09-06 MED ORDER — OSELTAMIVIR PHOSPHATE 75 MG PO CAPS
75.0000 mg | ORAL_CAPSULE | Freq: Two times a day (BID) | ORAL | 0 refills | Status: DC
  Filled 2023-09-06: qty 10, 5d supply, fill #0

## 2023-09-06 MED ORDER — AEROCHAMBER PLUS FLO-VU MISC
1.0000 | Freq: Once | Status: AC
  Administered 2023-09-06: 1
  Filled 2023-09-06: qty 1

## 2023-09-06 MED ORDER — LIDOCAINE VISCOUS HCL 2 % MT SOLN: 15.0000 mL | Freq: Once | OROMUCOSAL | Status: DC

## 2023-09-06 MED ORDER — OSELTAMIVIR PHOSPHATE 75 MG PO CAPS: 75.0000 mg | ORAL_CAPSULE | Freq: Two times a day (BID) | ORAL | 0 refills | Status: DC

## 2023-09-06 NOTE — ED Notes (Signed)
 Patient given inhaler and spacer for home use. Patient instructed on use/indications. Patient able to perform independently

## 2023-09-06 NOTE — Discharge Instructions (Addendum)
 Follow up with PCP.   Take steroids as prescribed. Use albuterol for shortness of breath. I have sent tamiflu which helps with flu, take as prescribed. Tessalon pearles for cough.   Fever - take 1000mg  of Tylenol every 6 hours.   Return with new or worsening symptoms.

## 2023-10-27 ENCOUNTER — Encounter (HOSPITAL_BASED_OUTPATIENT_CLINIC_OR_DEPARTMENT_OTHER): Payer: Self-pay

## 2023-10-27 ENCOUNTER — Emergency Department (HOSPITAL_BASED_OUTPATIENT_CLINIC_OR_DEPARTMENT_OTHER)
Admission: EM | Admit: 2023-10-27 | Discharge: 2023-10-27 | Disposition: A | Discharge status: Home / Self Care | Payer: MEDICAID

## 2023-10-27 ENCOUNTER — Emergency Department (HOSPITAL_BASED_OUTPATIENT_CLINIC_OR_DEPARTMENT_OTHER): Payer: MEDICAID | Admitting: Radiology

## 2023-10-27 ENCOUNTER — Other Ambulatory Visit: Payer: Self-pay

## 2023-10-27 DIAGNOSIS — J4521 Mild intermittent asthma with (acute) exacerbation: Secondary | ICD-10-CM

## 2023-10-27 DIAGNOSIS — J45909 Unspecified asthma, uncomplicated: Secondary | ICD-10-CM | POA: Insufficient documentation

## 2023-10-27 DIAGNOSIS — R0602 Shortness of breath: Secondary | ICD-10-CM | POA: Insufficient documentation

## 2023-10-27 MED ORDER — IPRATROPIUM BROMIDE 0.02 % IN SOLN
0.5000 mg | Freq: Once | RESPIRATORY_TRACT | Status: AC
  Administered 2023-10-27: 0.5 mg via RESPIRATORY_TRACT
  Filled 2023-10-27: qty 2.5

## 2023-10-27 MED ORDER — ALBUTEROL SULFATE (2.5 MG/3ML) 0.083% IN NEBU
5.0000 mg | INHALATION_SOLUTION | Freq: Once | RESPIRATORY_TRACT | Status: AC
  Administered 2023-10-27: 5 mg via RESPIRATORY_TRACT
  Filled 2023-10-27: qty 6

## 2023-10-27 MED ORDER — ALBUTEROL SULFATE HFA 108 (90 BASE) MCG/ACT IN AERS
2.0000 | INHALATION_SPRAY | Freq: Once | RESPIRATORY_TRACT | Status: AC
  Administered 2023-10-27: 2 via RESPIRATORY_TRACT
  Filled 2023-10-27: qty 6.7

## 2023-10-27 NOTE — Discharge Instructions (Addendum)
 You have 12 refills of your Symbicort  and albuterol  nebulizer solution at the [facility] to pick up.  Please return if develop fevers, chills, chest pain, worsening shortness of breath or any new or worsening symptoms that are concerning to you.

## 2023-10-27 NOTE — ED Provider Notes (Signed)
 [place] EMERGENCY DEPARTMENT AT [facility] Provider Note   CSN: [id number] Arrival date & time: 10/27/23  1656     History  Chief Complaint  Patient presents with   Shortness of Breath    Ronald Roberson is a 43 y.o. male.  43 year old presented for shortness of breath in the setting of asthma.  Been out of his albuterol  for the past day or 2.  Worsening symptoms.  Had breathing treatment in triage, feels improved.  Does not have a primary doctor and has been utilizing the emergency department to fill his asthma medications.   Shortness of Breath      Home Medications Prior to Admission medications   Medication Sig Start Date End Date Taking? Authorizing Provider  albuterol  (PROVENTIL ) (2.5 MG/3ML) 0.083% nebulizer solution Take 3 mLs (2.5 mg total) by nebulization every 6 (six) hours as needed for wheezing or shortness of breath. 02/20/23   Celesta Coke K, DO  albuterol  (PROVENTIL ) (2.5 MG/3ML) 0.083% nebulizer solution Take 3 mLs (2.5 mg total) by nebulization every 6 (six) hours as needed for wheezing or shortness of breath. 03/17/23   Dalene Duck, MD  albuterol  (VENTOLIN  HFA) 108 [phone] Base) MCG/ACT inhaler Inhale 2 puffs into the lungs every 4 (four) hours as needed for wheezing or shortness of breath. 07/18/23   Aberman, Caroline C, PA-C  benzonatate  (TESSALON ) 100 MG capsule Take 1 capsule (100 mg total) by mouth every 8 (eight) hours. 09/06/23   Floyd, Dan, DO  budesonide -formoterol  (SYMBICORT ) 80-4.5 MCG/ACT inhaler Inhale 2 puffs into the lungs 2 (two) times daily. 08/20/23   Almond Army, MD  cetirizine (ZYRTEC) 10 MG tablet Take 10 mg by mouth daily as needed for allergies.    [provider]  hydrocortisone  cream 1 % Apply to affected area 2 times daily 06/18/23   Deatra Face, MD  ibuprofen  (ADVIL ) 600 MG tablet Take 1 tablet (600 mg total) by mouth every 6 (six) hours as needed. 06/18/23   Deatra Face, MD  ipratropium-albuterol   (DUONEB) 0.5-2.5 (3) MG/3ML SOLN Take 3 mLs by nebulization every 4 (four) hours as needed. 06/04/23   Scarlette Currier, MD  oseltamivir  (TAMIFLU ) 75 MG capsule Take 1 capsule (75 mg total) by mouth every 12 (twelve) hours. 09/06/23   Albertus Hughs, DO  predniSONE  (DELTASONE ) 20 MG tablet Take 2 tablets (40 mg total) by mouth daily. 09/06/23   Floyd, Dan, DO      Allergies    Patient has no known allergies.    Review of Systems   Review of Systems  Respiratory:  Positive for shortness of breath.     Physical Exam Updated Vital Signs BP 125/86   Pulse 91   Temp 98.3 F (36.8 C)   Resp 20   SpO2 97%  Physical Exam Vitals and nursing note reviewed.  Constitutional:      General: He is not in acute distress.    Appearance: He is not toxic-appearing.  HENT:     Mouth/Throat:     Mouth: Mucous membranes are moist.  Cardiovascular:     Rate and Rhythm: Normal rate and regular rhythm.  Pulmonary:     Effort: Pulmonary effort is normal.     Breath sounds: Normal breath sounds. No wheezing, rhonchi or rales.  Musculoskeletal:     Cervical back: Normal range of motion.  Skin:    General: Skin is warm.     Capillary Refill: Capillary refill takes less than 2 seconds.  Neurological:  Mental Status: He is alert and oriented to person, place, and time.  Psychiatric:        Mood and Affect: Mood normal.        Behavior: Behavior normal.     ED Results / Procedures / Treatments   Labs (all labs ordered are listed, but only abnormal results are displayed) Labs Reviewed - No data to display  EKG None  Radiology No results found.  Procedures Procedures    Medications Ordered in ED Medications  albuterol  (VENTOLIN  HFA) 108 (90 Base) MCG/ACT inhaler 2 puff (has no administration in time range)  albuterol  (PROVENTIL ) (2.5 MG/3ML) 0.083% nebulizer solution 5 mg (5 mg Nebulization Given 10/27/23 1717)  ipratropium (ATROVENT ) nebulizer solution 0.5 mg (0.5 mg Nebulization Given  10/27/23 1717)    ED Course/ Medical Decision Making/ A&P                                 Medical Decision Making This is a well-appearing 43 year old male presenting emergency department for shortness of breath in the setting of his asthma.  No fevers tachycardia maintaining oxygen saturation on room air.  Is not appear to be in respiratory distress.  Received a breathing treatment in triage, lungs are clear and does not appear to be in respiratory distress.  Albuterol  inhaler ordered.  Chest x-ray ordered in triage.  Reviewed no pneumonia, pneumothorax, and read interpretation.  Awaiting final radiology read.  Will discharge if no acute findings.  Amount and/or Complexity of Data Reviewed External Data Reviewed:     Details: Appears he is utilizing the emergency department for primary doctor as he is here once a month for exacerbation or medication refill. Labs:     Details: Low suspicion for infectious process or metabolic derangements.  Labs unlikely to change management or disposition. Radiology: independent interpretation performed.  Risk Prescription drug management. Decision regarding hospitalization. Diagnosis or treatment significantly limited by social determinants of health. Risk Details: Poor health literacy, no insurance.         Final Clinical Impression(s) / ED Diagnoses Final diagnoses:  None    Rx / DC Orders ED Discharge Orders     None         Rolinda Climes, DO 10/27/23 1824

## 2023-10-27 NOTE — ED Triage Notes (Signed)
 Hx of asthma. SOB since yesterday. Waiting for insurance to kick in before he can get inhaler.  Expiratory wheezes in triage

## 2023-10-29 ENCOUNTER — Other Ambulatory Visit (HOSPITAL_BASED_OUTPATIENT_CLINIC_OR_DEPARTMENT_OTHER): Payer: Self-pay

## 2023-10-29 ENCOUNTER — Other Ambulatory Visit: Payer: Self-pay

## 2024-01-17 ENCOUNTER — Emergency Department (HOSPITAL_BASED_OUTPATIENT_CLINIC_OR_DEPARTMENT_OTHER)
Admission: EM | Admit: 2024-01-17 | Discharge: 2024-01-17 | Disposition: A | Discharge status: Home / Self Care | Payer: MEDICAID | Attending: Emergency Medicine | Admitting: Emergency Medicine

## 2024-01-17 ENCOUNTER — Encounter (HOSPITAL_BASED_OUTPATIENT_CLINIC_OR_DEPARTMENT_OTHER): Payer: Self-pay | Admitting: Emergency Medicine

## 2024-01-17 ENCOUNTER — Other Ambulatory Visit: Payer: Self-pay

## 2024-01-17 DIAGNOSIS — J45909 Unspecified asthma, uncomplicated: Secondary | ICD-10-CM | POA: Diagnosis not present

## 2024-01-17 DIAGNOSIS — M545 Low back pain, unspecified: Secondary | ICD-10-CM | POA: Insufficient documentation

## 2024-01-17 DIAGNOSIS — Z7951 Long term (current) use of inhaled steroids: Secondary | ICD-10-CM | POA: Diagnosis not present

## 2024-01-17 DIAGNOSIS — Z7952 Long term (current) use of systemic steroids: Secondary | ICD-10-CM | POA: Diagnosis not present

## 2024-01-17 LAB — URINALYSIS, ROUTINE W REFLEX MICROSCOPIC
Bacteria, UA: NONE SEEN
Bilirubin Urine: NEGATIVE
Glucose, UA: NEGATIVE mg/dL
Ketones, ur: NEGATIVE mg/dL
Leukocytes,Ua: NEGATIVE
Nitrite: NEGATIVE
Protein, ur: NEGATIVE mg/dL
Specific Gravity, Urine: 1.025 (ref 1.005–1.030)
pH: 5.5 (ref 5.0–8.0)

## 2024-01-17 LAB — BASIC METABOLIC PANEL WITH GFR
Anion gap: 11 (ref 5–15)
BUN: 13 mg/dL (ref 6–20)
CO2: 21 mmol/L — ABNORMAL LOW (ref 22–32)
Calcium: 9.5 mg/dL (ref 8.9–10.3)
Chloride: 102 mmol/L (ref 98–111)
Creatinine, Ser: 0.93 mg/dL (ref 0.61–1.24)
GFR, Estimated: >60 mL/min (ref >60)
Glucose, Bld: 125 mg/dL — ABNORMAL HIGH (ref 70–99)
Potassium: 4.1 mmol/L (ref 3.5–5.1)
Sodium: 135 mmol/L (ref 135–145)

## 2024-01-17 MED ORDER — ALBUTEROL SULFATE HFA 108 (90 BASE) MCG/ACT IN AERS
2.0000 | INHALATION_SPRAY | Freq: Once | RESPIRATORY_TRACT | Status: AC
  Administered 2024-01-17: 2 via RESPIRATORY_TRACT
  Filled 2024-01-17: qty 6.7

## 2024-01-17 MED ORDER — ACETAMINOPHEN 500 MG PO TABS
1000.0000 mg | ORAL_TABLET | Freq: Once | ORAL | Status: AC
Start: 2024-01-17 — End: 2024-01-17
  Administered 2024-01-17: 1000 mg via ORAL
  Filled 2024-01-17: qty 2

## 2024-01-17 NOTE — ED Triage Notes (Signed)
 Pt caox4, ambulatory NAD c/o lower back pain x1 wk denying any recent trauma or injury, stating I think it is something to do with my kidneys, however denies any additional urinary s/s.

## 2024-01-17 NOTE — Discharge Instructions (Signed)
 Your kidney function and urinalysis today were normal.  Suspect muscle strain causing your back pain.  You can take ibuprofen  and Tylenol  every 6 hours as needed to help with pain you can also do some light stretching and back exercises provided on your paperwork today.  Follow-up with your primary care provider if symptoms or not improving.

## 2024-01-17 NOTE — ED Provider Notes (Signed)
 [place] EMERGENCY DEPARTMENT AT [facility] Provider Note   CSN: [id number] Arrival date & time: 01/17/24  1039     Patient presents with: Back Pain   Ronald Roberson is a 43 y.o. male.   Ronald Roberson is a 43 y.o. male with a history of asthma, who presents to the emergency department for evaluation of low back pain.  He reports he first noticed the pain a week ago while he was on vacation at the beach it seemed to calm down but then worsened again last night he describes the pain as a constant aching across his low back.  Denies any injury or trauma to the area.  No radiation of pain into his legs, no numbness, weakness or tingling, no loss of bowel or bladder control.  No abdominal pain, nausea, vomiting or diarrhea.  Nothing makes pain better or worse he has not tried any medications to treat this pain.  He is concerned that it could be related to his kidneys, denies dysuria, urinary frequency or hematuria, no prior history of kidney issues or kidney stones.  Also requesting a new inhaler for his asthma as he ran out of his  The history is provided by the patient and medical records.  Back Pain Associated symptoms: no abdominal pain, no chest pain, no dysuria, no fever, no numbness and no weakness        Prior to Admission medications   Medication Sig Start Date End Date Taking? Authorizing Provider  albuterol  (PROVENTIL ) (2.5 MG/3ML) 0.083% nebulizer solution Take 3 mLs (2.5 mg total) by nebulization every 6 (six) hours as needed for wheezing or shortness of breath. 02/20/23   Ellouise Fine K, DO  albuterol  (PROVENTIL ) (2.5 MG/3ML) 0.083% nebulizer solution Take 3 mLs (2.5 mg total) by nebulization every 6 (six) hours as needed for wheezing or shortness of breath. 03/17/23   Patt Alm Macho, MD  albuterol  (VENTOLIN  HFA) 108 (90 Base) MCG/ACT inhaler Inhale 2 puffs into the lungs every 4 (four) hours as needed for wheezing or shortness of breath. 07/18/23   Aberman,  Caroline C, PA-C  benzonatate  (TESSALON ) 100 MG capsule Take 1 capsule (100 mg total) by mouth every 8 (eight) hours. 09/06/23   Floyd, Dan, DO  budesonide -formoterol  (SYMBICORT ) 80-4.5 MCG/ACT inhaler Inhale 2 puffs into the lungs 2 (two) times daily. 08/20/23   Doretha Folks, MD  cetirizine (ZYRTEC) 10 MG tablet Take 10 mg by mouth daily as needed for allergies.    [provider]  hydrocortisone  cream 1 % Apply to affected area 2 times daily 06/18/23   Charlyn Sora, MD  ibuprofen  (ADVIL ) 600 MG tablet Take 1 tablet (600 mg total) by mouth every 6 (six) hours as needed. 06/18/23   Charlyn Sora, MD  ipratropium-albuterol  (DUONEB) 0.5-2.5 (3) MG/3ML SOLN Take 3 mLs by nebulization every 4 (four) hours as needed. 06/04/23   Dreama Longs, MD  oseltamivir  (TAMIFLU ) 75 MG capsule Take 1 capsule (75 mg total) by mouth every 12 (twelve) hours. 09/06/23   Floyd, Dan, DO  predniSONE  (DELTASONE ) 20 MG tablet Take 2 tablets (40 mg total) by mouth daily. 09/06/23   Floyd, Dan, DO    Allergies: Patient has no known allergies.    Review of Systems  Constitutional:  Negative for chills and fever.  HENT: Negative.    Respiratory:  Negative for shortness of breath.   Cardiovascular:  Negative for chest pain.  Gastrointestinal:  Negative for abdominal pain, constipation, diarrhea, nausea and vomiting.  Genitourinary:  Negative for dysuria, flank pain, frequency and hematuria.  Musculoskeletal:  Positive for back pain. Negative for arthralgias, gait problem, joint swelling, myalgias and neck pain.  Skin:  Negative for color change, rash and wound.  Neurological:  Negative for weakness and numbness.    Updated Vital Signs BP 115/81   Pulse 70   Temp (!) 97.2 F (36.2 C)   Resp 18   Ht 5' 11 (1.803 m)   Wt 127 kg   SpO2 95%   BMI 39.05 kg/m   Physical Exam Vitals and nursing note reviewed.  Constitutional:      General: He is not in acute distress.    Appearance: Normal  appearance. He is well-developed. He is not ill-appearing or diaphoretic.  HENT:     Head: Atraumatic.  Eyes:     General:        Right eye: No discharge.        Left eye: No discharge.  Cardiovascular:     Rate and Rhythm: Normal rate and regular rhythm.     Pulses:          Radial pulses are 2+ on the right side and 2+ on the left side.       Dorsalis pedis pulses are 2+ on the right side and 2+ on the left side.       Posterior tibial pulses are 2+ on the right side and 2+ on the left side.  Pulmonary:     Effort: Pulmonary effort is normal. No respiratory distress.     Breath sounds: Normal breath sounds.  Abdominal:     General: Bowel sounds are normal. There is no distension.     Palpations: Abdomen is soft. There is no mass.     Tenderness: There is no abdominal tenderness. There is no guarding.     Comments: Abdomen soft, nondistended, nontender to palpation in all quadrants without guarding or peritoneal signs, no CVA tenderness bilaterally  Musculoskeletal:     Cervical back: Neck supple.     Comments: Tenderness to palpation over low back musculature, no focal tenderness or deformity at midline, negative straight leg raise bilaterally.  Skin:    General: Skin is warm and dry.     Capillary Refill: Capillary refill takes less than 2 seconds.  Neurological:     Mental Status: He is alert and oriented to person, place, and time.     Comments: Alert, clear speech, following commands. Moving all extremities without difficulty. Bilateral lower extremities with 5/5 strength in proximal and distal muscle groups and with dorsi and plantar flexion. Sensation intact in bilateral lower extremities. 2+ patellar DTRs bilaterally. Ambulatory with steady gait  Psychiatric:        Behavior: Behavior normal.     (all labs ordered are listed, but only abnormal results are displayed) Labs Reviewed  URINALYSIS, ROUTINE W REFLEX MICROSCOPIC - Abnormal; Notable for the following  components:      Result Value   Hgb urine dipstick SMALL (*)    All other components within normal limits  BASIC METABOLIC PANEL WITH GFR - Abnormal; Notable for the following components:   CO2 21 (*)    Glucose, Bld 125 (*)    All other components within normal limits    EKG: None  Radiology: No results found.   Procedures   Medications Ordered in the ED  acetaminophen  (TYLENOL ) tablet 1,000 mg (1,000 mg Oral Given 01/17/24 1411)  albuterol  (VENTOLIN  HFA) 108 (90 Base)  MCG/ACT inhaler 2 puff (2 puffs Inhalation Given 01/17/24 1353)                                    Medical Decision Making Amount and/or Complexity of Data Reviewed Labs: ordered.  Risk OTC drugs. Prescription drug management.   Patient with back pain.  No neurological deficits and normal neuro exam.  Patient ambulatory.  No loss of bowel or bladder control.  No concern for cauda equina.  No fever, night sweats, weight loss, h/o cancer, IVDU.  Patient concerned for kidney issue, urinalysis without signs of infection or hematuria present and BUN and creatinine within normal limits.  RICE protocol and pain medicine indicated and discussed with patient.   At this time there does not appear to be any evidence of an acute emergency medical condition requiring further emergent evaluation and the patient appears stable for discharge with appropriate outpatient follow up. Diagnosis and return precautions discussed with patient who verbalizes understanding and is agreeable to discharge.       Final diagnoses:  Acute midline low back pain, unspecified whether sciatica present    ED Discharge Orders     None          Alva Larraine JULIANNA DEVONNA 01/17/24 1532    Geraldene Hamilton, MD 01/18/24 [phone]

## 2024-02-06 ENCOUNTER — Other Ambulatory Visit: Payer: Self-pay

## 2024-02-06 ENCOUNTER — Emergency Department (HOSPITAL_BASED_OUTPATIENT_CLINIC_OR_DEPARTMENT_OTHER)
Admission: EM | Admit: 2024-02-06 | Discharge: 2024-02-06 | Disposition: A | Discharge status: Home / Self Care | Payer: MEDICAID | Attending: Emergency Medicine | Admitting: Emergency Medicine

## 2024-02-06 ENCOUNTER — Encounter (HOSPITAL_BASED_OUTPATIENT_CLINIC_OR_DEPARTMENT_OTHER): Payer: Self-pay

## 2024-02-06 ENCOUNTER — Emergency Department (HOSPITAL_BASED_OUTPATIENT_CLINIC_OR_DEPARTMENT_OTHER): Payer: MEDICAID

## 2024-02-06 DIAGNOSIS — R9341 Abnormal radiologic findings on diagnostic imaging of renal pelvis, ureter, or bladder: Secondary | ICD-10-CM | POA: Insufficient documentation

## 2024-02-06 DIAGNOSIS — R109 Unspecified abdominal pain: Secondary | ICD-10-CM | POA: Diagnosis present

## 2024-02-06 DIAGNOSIS — D72829 Elevated white blood cell count, unspecified: Secondary | ICD-10-CM | POA: Diagnosis not present

## 2024-02-06 DIAGNOSIS — K5900 Constipation, unspecified: Secondary | ICD-10-CM | POA: Insufficient documentation

## 2024-02-06 DIAGNOSIS — F1910 Other psychoactive substance abuse, uncomplicated: Secondary | ICD-10-CM | POA: Insufficient documentation

## 2024-02-06 LAB — COMPREHENSIVE METABOLIC PANEL WITH GFR
ALT: 34 U/L (ref 0–44)
AST: 35 U/L (ref 15–41)
Albumin: 4.3 g/dL (ref 3.5–5.0)
Alkaline Phosphatase: 145 U/L — ABNORMAL HIGH (ref 38–126)
Anion gap: 12 (ref 5–15)
BUN: 19 mg/dL (ref 6–20)
CO2: 25 mmol/L (ref 22–32)
Calcium: 9.9 mg/dL (ref 8.9–10.3)
Chloride: 100 mmol/L (ref 98–111)
Creatinine, Ser: 1.06 mg/dL (ref 0.61–1.24)
GFR, Estimated: >60 mL/min (ref >60)
Glucose, Bld: 100 mg/dL — ABNORMAL HIGH (ref 70–99)
Potassium: 4 mmol/L (ref 3.5–5.1)
Sodium: 137 mmol/L (ref 135–145)
Total Bilirubin: 0.3 mg/dL (ref 0.0–1.2)
Total Protein: 7.4 g/dL (ref 6.5–8.1)

## 2024-02-06 LAB — URINALYSIS, ROUTINE W REFLEX MICROSCOPIC
Bacteria, UA: NONE SEEN
Bilirubin Urine: NEGATIVE
Glucose, UA: NEGATIVE mg/dL
Ketones, ur: NEGATIVE mg/dL
Leukocytes,Ua: NEGATIVE
Nitrite: NEGATIVE
Protein, ur: 30 mg/dL — AB
Specific Gravity, Urine: >1.046 — ABNORMAL HIGH (ref 1.005–1.030)
pH: 6 (ref 5.0–8.0)

## 2024-02-06 LAB — CBC
HCT: 39.8 % (ref 39.0–52.0)
Hemoglobin: 13.5 g/dL (ref 13.0–17.0)
MCH: 31.5 pg (ref 26.0–34.0)
MCHC: 33.9 g/dL (ref 30.0–36.0)
MCV: 93 fL (ref 80.0–100.0)
Platelets: 303 K/uL (ref 150–400)
RBC: 4.28 MIL/uL (ref 4.22–5.81)
RDW: 13.1 % (ref 11.5–15.5)
WBC: 13.4 K/uL — ABNORMAL HIGH (ref 4.0–10.5)
nRBC: 0 % (ref 0.0–0.2)

## 2024-02-06 LAB — LIPASE, BLOOD: Lipase: 27 U/L (ref 11–51)

## 2024-02-06 MED ORDER — IOHEXOL 300 MG/ML  SOLN
100.0000 mL | Freq: Once | INTRAMUSCULAR | Status: AC | PRN
  Administered 2024-02-06: 100 mL via INTRAVENOUS

## 2024-02-06 NOTE — ED Triage Notes (Signed)
 Pt presents via POV c/o constipation and nausea. Denies abd pain currently. Reports last BM x2 days ago. Reports feels nauseated but no emesis.

## 2024-02-06 NOTE — Discharge Instructions (Addendum)
 Take over the counter laxatives and stool softeners such as Miralax, colace, sennecot and magnesium  citrate for your constipation.

## 2024-02-06 NOTE — ED Provider Notes (Signed)
 [place] EMERGENCY DEPARTMENT AT [facility]  Provider Note  CSN: [id number] Arrival date & time: 02/06/24 0255  History Chief Complaint  Patient presents with   Nausea   Constipation    Ronald Roberson is a 43 y.o. male reports he has not had a bowel movement in 2 days. He has had some abdominal cramping and nausea but no vomiting. No fever. Some urinary hesitancy and cough recently. He has not tried taking any medications to help with his symptoms. Noted to be eating cheetos on arrival to the ED.    Home Medications Prior to Admission medications   Medication Sig Start Date End Date Taking? Authorizing Provider  albuterol  (VENTOLIN  HFA) 108 (90 Base) MCG/ACT inhaler Inhale 2 puffs into the lungs every 4 (four) hours as needed for wheezing or shortness of breath. 07/18/23   Aberman, Caroline C, PA-C  benzonatate  (TESSALON ) 100 MG capsule Take 1 capsule (100 mg total) by mouth every 8 (eight) hours. 09/06/23   Floyd, Dan, DO  budesonide -formoterol  (SYMBICORT ) 80-4.5 MCG/ACT inhaler Inhale 2 puffs into the lungs 2 (two) times daily. 08/20/23   Doretha Folks, MD  cetirizine (ZYRTEC) 10 MG tablet Take 10 mg by mouth daily as needed for allergies.    [provider]  hydrocortisone  cream 1 % Apply to affected area 2 times daily 06/18/23   Charlyn Sora, MD  ibuprofen  (ADVIL ) 600 MG tablet Take 1 tablet (600 mg total) by mouth every 6 (six) hours as needed. 06/18/23   Charlyn Sora, MD  ipratropium-albuterol  (DUONEB) 0.5-2.5 (3) MG/3ML SOLN Take 3 mLs by nebulization every 4 (four) hours as needed. 06/04/23   Dreama Longs, MD     Allergies    Patient has no known allergies.   Review of Systems   Review of Systems Please see HPI for pertinent positives and negatives  Physical Exam BP 124/83   Pulse 88   Temp 98.3 F (36.8 C) (Oral)   Resp 20   Ht 5' 11 (1.803 m)   Wt 127 kg   SpO2 99%   BMI 39.05 kg/m   Physical Exam Vitals and nursing note  reviewed.  Constitutional:      Appearance: Normal appearance.  HENT:     Head: Normocephalic and atraumatic.     Nose: Nose normal.     Mouth/Throat:     Mouth: Mucous membranes are moist.  Eyes:     Extraocular Movements: Extraocular movements intact.     Conjunctiva/sclera: Conjunctivae normal.  Cardiovascular:     Rate and Rhythm: Normal rate.  Pulmonary:     Effort: Pulmonary effort is normal.     Breath sounds: Normal breath sounds.  Abdominal:     General: Abdomen is flat.     Palpations: Abdomen is soft. There is no mass.     Tenderness: There is no abdominal tenderness. There is no guarding.  Musculoskeletal:        General: No swelling. Normal range of motion.     Cervical back: Neck supple.  Skin:    General: Skin is warm and dry.  Neurological:     General: No focal deficit present.     Mental Status: He is alert.  Psychiatric:        Mood and Affect: Mood normal.     ED Results / Procedures / Treatments   EKG None  Procedures Procedures  Medications Ordered in the ED Medications  iohexol  (OMNIPAQUE ) 300 MG/ML solution 100 mL (100 mLs Intravenous  Contrast Given 02/06/24 0400)    Initial Impression and Plan  Patient here with 2 days of constipation. Has not tried any laxatives or stool softeners. His vitals and exam are reassuring. Discussed ED workup, including labs and consideration of imaging vs trying OTC medications. He would like to proceed with ED workup at this time.   ED Course   Clinical Course as of 02/06/24 0513  Wed Feb 06, 2024  0319 CBC with mild leukocytosis [CS]  0341 CMP and lipase are unremarkable.  [CS]  [phone] I personally viewed the images from radiology studies and agree with radiologist interpretation: CT neg for acute process. Incidental bladder findings discussed with patient, recommend urology follow up.  [CS]  0505 UA is concentrated, but no infection. Patient reported to RN he has been using THC, meth and cocaine, denied  illicit drug use to me during my initial evaluation.  [CS]  0510 Patient advised to drink more fluids, take OTC laxatives to help with his constipation. Follow up with Urology as above.  [CS]    Clinical Course User Index [CS] Roselyn Carlin NOVAK, MD     MDM Rules/Calculators/A&P Medical Decision Making Problems Addressed: Abnormal CT scan, bladder: undiagnosed new problem with uncertain prognosis Constipation, unspecified constipation type: acute illness or injury Polysubstance abuse [facility]): chronic illness or injury  Amount and/or Complexity of Data Reviewed Labs: ordered. Decision-making details documented in ED Course. Radiology: ordered and independent interpretation performed. Decision-making details documented in ED Course.  Risk Prescription drug management.     Final Clinical Impression(s) / ED Diagnoses Final diagnoses:  Constipation, unspecified constipation type  Polysubstance abuse (HCC)  Abnormal CT scan, bladder    Rx / DC Orders ED Discharge Orders     None        Roselyn Carlin NOVAK, MD 02/06/24 [phone]

## 2024-03-13 ENCOUNTER — Encounter (HOSPITAL_BASED_OUTPATIENT_CLINIC_OR_DEPARTMENT_OTHER): Payer: Self-pay

## 2024-03-13 ENCOUNTER — Emergency Department (HOSPITAL_BASED_OUTPATIENT_CLINIC_OR_DEPARTMENT_OTHER)
Admission: EM | Admit: 2024-03-13 | Discharge: 2024-03-13 | Disposition: A | Discharge status: Home / Self Care | Payer: MEDICAID | Attending: Emergency Medicine | Admitting: Emergency Medicine

## 2024-03-13 ENCOUNTER — Emergency Department (HOSPITAL_BASED_OUTPATIENT_CLINIC_OR_DEPARTMENT_OTHER): Payer: MEDICAID | Admitting: Radiology

## 2024-03-13 ENCOUNTER — Other Ambulatory Visit: Payer: Self-pay

## 2024-03-13 DIAGNOSIS — J45901 Unspecified asthma with (acute) exacerbation: Secondary | ICD-10-CM | POA: Diagnosis not present

## 2024-03-13 DIAGNOSIS — R0602 Shortness of breath: Secondary | ICD-10-CM | POA: Diagnosis present

## 2024-03-13 LAB — RESP PANEL BY RT-PCR (RSV, FLU A&B, COVID)  RVPGX2
Influenza A by PCR: NEGATIVE
Influenza B by PCR: NEGATIVE
Resp Syncytial Virus by PCR: NEGATIVE
SARS Coronavirus 2 by RT PCR: NEGATIVE

## 2024-03-13 MED ORDER — ALBUTEROL SULFATE (2.5 MG/3ML) 0.083% IN NEBU
2.5000 mg | INHALATION_SOLUTION | Freq: Once | RESPIRATORY_TRACT | Status: AC
  Administered 2024-03-13: 2.5 mg via RESPIRATORY_TRACT
  Filled 2024-03-13: qty 3

## 2024-03-13 MED ORDER — PREDNISONE 50 MG PO TABS
60.0000 mg | ORAL_TABLET | Freq: Once | ORAL | Status: AC
  Administered 2024-03-13: 60 mg via ORAL
  Filled 2024-03-13: qty 1

## 2024-03-13 MED ORDER — ACETAMINOPHEN 500 MG PO TABS
1000.0000 mg | ORAL_TABLET | Freq: Once | ORAL | Status: AC
  Administered 2024-03-13: 1000 mg via ORAL
  Filled 2024-03-13: qty 2

## 2024-03-13 MED ORDER — IPRATROPIUM-ALBUTEROL 0.5-2.5 (3) MG/3ML IN SOLN
3.0000 mL | Freq: Once | RESPIRATORY_TRACT | Status: AC
  Administered 2024-03-13: 3 mL via RESPIRATORY_TRACT
  Filled 2024-03-13: qty 3

## 2024-03-13 MED ORDER — PREDNISONE 20 MG PO TABS: 40.0000 mg | ORAL_TABLET | Freq: Every day | ORAL | 0 refills | Status: AC

## 2024-03-13 MED ORDER — IPRATROPIUM-ALBUTEROL 0.5-2.5 (3) MG/3ML IN SOLN
3.0000 mL | RESPIRATORY_TRACT | Status: AC
  Administered 2024-03-13 (×2): 3 mL via RESPIRATORY_TRACT
  Filled 2024-03-13: qty 6

## 2024-03-13 MED ORDER — ALBUTEROL SULFATE HFA 108 (90 BASE) MCG/ACT IN AERS
2.0000 | INHALATION_SPRAY | RESPIRATORY_TRACT | Status: DC | PRN
  Administered 2024-03-13: 2 via RESPIRATORY_TRACT
  Filled 2024-03-13: qty 6.7

## 2024-03-13 MED ORDER — ALBUTEROL SULFATE HFA 108 (90 BASE) MCG/ACT IN AERS: 2.0000 | INHALATION_SPRAY | RESPIRATORY_TRACT | 0 refills | Status: AC | PRN

## 2024-03-13 MED ORDER — BUDESONIDE-FORMOTEROL FUMARATE 80-4.5 MCG/ACT IN AERO: 2.0000 | INHALATION_SPRAY | Freq: Two times a day (BID) | RESPIRATORY_TRACT | 12 refills | Status: AC

## 2024-03-13 NOTE — ED Provider Notes (Signed)
 [place] EMERGENCY DEPARTMENT AT [facility] Provider Note   CSN: [id number] Arrival date & time: 03/13/24  0154     Patient presents with: Shortness of Breath   Ronald Roberson is a 43 y.o. male.   Resents to the emergency department for evaluation of shortness of breath.  Patient reports a history of asthma, does not currently have an inhaler.  He has been experiencing some mild nasal congestion prior to onset of the wheezing.  No fever or cough.       Prior to Admission medications   Medication Sig Start Date End Date Taking? Authorizing Provider  predniSONE  (DELTASONE ) 20 MG tablet Take 2 tablets (40 mg total) by mouth daily with breakfast. 03/13/24  Yes Kamil Hanigan, Lonni PARAS, MD  albuterol  (VENTOLIN  HFA) 108 (90 Base) MCG/ACT inhaler Inhale 2 puffs into the lungs every 4 (four) hours as needed for wheezing or shortness of breath. 03/13/24   Haze Lonni PARAS, MD  benzonatate  (TESSALON ) 100 MG capsule Take 1 capsule (100 mg total) by mouth every 8 (eight) hours. 09/06/23   Floyd, Dan, DO  budesonide -formoterol  (SYMBICORT ) 80-4.5 MCG/ACT inhaler Inhale 2 puffs into the lungs 2 (two) times daily. 03/13/24   Haze Lonni PARAS, MD  cetirizine (ZYRTEC) 10 MG tablet Take 10 mg by mouth daily as needed for allergies.    [provider]  hydrocortisone  cream 1 % Apply to affected area 2 times daily 06/18/23   Charlyn Sora, MD  ibuprofen  (ADVIL ) 600 MG tablet Take 1 tablet (600 mg total) by mouth every 6 (six) hours as needed. 06/18/23   Charlyn Sora, MD  ipratropium-albuterol  (DUONEB) 0.5-2.5 (3) MG/3ML SOLN Take 3 mLs by nebulization every 4 (four) hours as needed. 06/04/23   Dreama Longs, MD    Allergies: Patient has no known allergies.    Review of Systems  Updated Vital Signs BP 121/87 (BP Location: Right Arm)   Pulse 90   Temp 98.4 F (36.9 C) (Oral)   Resp (!) 22   Ht 5' 11 (1.803 m)   Wt 122.5 kg   SpO2 97%   BMI 37.66 kg/m    Physical Exam Vitals and nursing note reviewed.  Constitutional:      General: He is not in acute distress.    Appearance: He is well-developed.  HENT:     Head: Normocephalic and atraumatic.     Mouth/Throat:     Mouth: Mucous membranes are moist.  Eyes:     General: Vision grossly intact. Gaze aligned appropriately.     Extraocular Movements: Extraocular movements intact.     Conjunctiva/sclera: Conjunctivae normal.  Cardiovascular:     Rate and Rhythm: Normal rate and regular rhythm.     Pulses: Normal pulses.     Heart sounds: Normal heart sounds, S1 normal and S2 normal. No murmur heard.    No friction rub. No gallop.  Pulmonary:     Effort: Pulmonary effort is normal. No respiratory distress.     Breath sounds: Decreased breath sounds and wheezing present.  Abdominal:     Palpations: Abdomen is soft.     Tenderness: There is no abdominal tenderness. There is no guarding or rebound.     Hernia: No hernia is present.  Musculoskeletal:        General: No swelling.     Cervical back: Full passive range of motion without pain, normal range of motion and neck supple. No pain with movement, spinous process tenderness or muscular tenderness. Normal range  of motion.     Right lower leg: No edema.     Left lower leg: No edema.  Skin:    General: Skin is warm and dry.     Capillary Refill: Capillary refill takes less than 2 seconds.     Findings: No ecchymosis, erythema, lesion or wound.  Neurological:     Mental Status: He is alert and oriented to person, place, and time.     GCS: GCS eye subscore is 4. GCS verbal subscore is 5. GCS motor subscore is 6.     Cranial Nerves: Cranial nerves 2-12 are intact.     Sensory: Sensation is intact.     Motor: Motor function is intact. No weakness or abnormal muscle tone.     Coordination: Coordination is intact.  Psychiatric:        Mood and Affect: Mood normal.        Speech: Speech normal.        Behavior: Behavior normal.      (all labs ordered are listed, but only abnormal results are displayed) Labs Reviewed  RESP PANEL BY RT-PCR (RSV, FLU A&B, COVID)  [id number]    EKG: None  Radiology: DG Chest 2 View Result Date: 03/13/2024 CLINICAL DATA:  Initial evaluation for acute shortness of breath. EXAM: CHEST - 2 VIEW COMPARISON:  Prior radiograph from 10/27/2023. FINDINGS: Cardiac and mediastinal silhouettes are stable in size and contour, and remain within normal limits. Lungs normally inflated. Scattered peribronchial thickening, most pronounced centrally. No focal infiltrates. No pulmonary edema or pleural effusion. No pneumothorax. Visualized osseous structures demonstrate no acute finding. IMPRESSION: Scattered peribronchial thickening, most pronounced centrally. Finding could reflect sequelae of acute bronchiolitis and/or reactive airways disease. No consolidative opacity to suggest pneumonia. Electronically Signed   By: Morene Hoard M.D.   On: 03/13/2024 02:34     Procedures   Medications Ordered in the ED  predniSONE  (DELTASONE ) tablet 60 mg (has no administration in time range)  albuterol  (VENTOLIN  HFA) 108 (90 Base) MCG/ACT inhaler 2 puff (has no administration in time range)  albuterol  (PROVENTIL ) (2.5 MG/3ML) 0.083% nebulizer solution 2.5 mg (2.5 mg Nebulization Given 03/13/24 0220)  ipratropium-albuterol  (DUONEB) 0.5-2.5 (3) MG/3ML nebulizer solution 3 mL (3 mLs Nebulization Given 03/13/24 0219)  ipratropium-albuterol  (DUONEB) 0.5-2.5 (3) MG/3ML nebulizer solution 3 mL (3 mLs Nebulization Given 03/13/24 0517)  acetaminophen  (TYLENOL ) tablet 1,000 mg (1,000 mg Oral Given 03/13/24 0505)                                    Medical Decision Making Amount and/or Complexity of Data Reviewed Radiology: ordered.  Risk OTC drugs. Prescription drug management.   Differential Diagnosis considered includes, but not limited to: Asthma exacerbation; Bronchitis; Pneumonia; CHF; ACS; PE  Patient  presents with shortness of breath, wheezing.  He has a history of asthma.  Patient given serial breathing treatments with significant improvement.  No hypoxia.  Currently moving air well without residual wheezing.  Chest x-ray without pneumonia.     Final diagnoses:  Asthma with acute exacerbation, unspecified asthma severity, unspecified whether persistent    ED Discharge Orders          Ordered    predniSONE  (DELTASONE ) 20 MG tablet  Daily with breakfast        03/13/24 0538    albuterol  (VENTOLIN  HFA) 108 (90 Base) MCG/ACT inhaler  Every 4 hours PRN  03/13/24 0538    budesonide -formoterol  (SYMBICORT ) 80-4.5 MCG/ACT inhaler  2 times daily        03/13/24 0538               Haze Lonni PARAS, MD 03/13/24 [phone]

## 2024-03-13 NOTE — ED Triage Notes (Signed)
 Describes shortness of breath, nasal congestion and chest tightness.   Says he is out of inhaler, and nebulizer for his asthma.   Admits to smoking cigarettes

## 2024-03-13 NOTE — ED Notes (Signed)
 Discharge instructions reviewed.   Newly prescribed medications discussed. Pharmacy verified.   Opportunity for questions and concerns provided.   Alert, oriented and ambulatory.   Displays no signs of distress.

## 2024-03-13 NOTE — ED Notes (Addendum)
 Patient requesting re-evaluation by RT. VS repeated. Patient with increased WOB returning. BBS diminished with expiratory wheezing. EDP contacted and VO given for 2 doses of Duoneb. Treatment administered.
